# Patient Record
Sex: Female | Born: 1990 | Hispanic: Yes | Marital: Single | Smoking: Never smoker
Health system: Southern US, Community
[De-identification: ages and names within clinical notes are randomized; demographics above are authoritative.]

## PROBLEM LIST (undated history)

## (undated) DIAGNOSIS — I10 Essential (primary) hypertension: Secondary | ICD-10-CM

## (undated) DIAGNOSIS — Z789 Other specified health status: Secondary | ICD-10-CM

## (undated) HISTORY — PX: NO PAST SURGERIES: SHX2092

## (undated) HISTORY — DX: Essential (primary) hypertension: I10

---
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
## 2010-08-16 ENCOUNTER — Inpatient Hospital Stay (HOSPITAL_COMMUNITY)
Admission: AD | Admit: 2010-08-16 | Discharge: 2010-08-16 | Payer: Self-pay | Source: Home / Self Care | Admitting: Obstetrics & Gynecology

## 2010-08-27 ENCOUNTER — Ambulatory Visit (HOSPITAL_COMMUNITY)
Admission: RE | Admit: 2010-08-27 | Discharge: 2010-08-27 | Payer: Self-pay | Source: Home / Self Care | Attending: Family Medicine | Admitting: Family Medicine

## 2010-08-27 ENCOUNTER — Encounter: Payer: Self-pay | Admitting: Family Medicine

## 2010-10-17 IMAGING — US US OB TRANSVAGINAL
1 series · 14 of 28 positions shown · non-contrast
Comparison: Pelvic ultrasound 08/17/2009

CLINICAL DATA: Question miscarriage.  The patient has not had a
menstrual period since prior ultrasound performed on 08/17/2009.

TRANSVAGINAL OB ULTRASOUND
TECHNIQUE: Transabdominal ultrasound was performed for evaluation
of the gestation as well as the maternal uterus and adnexal
regions.

[Series 1: us ob comp less 14 wks · 0.12mm/px · 14 of 40 slices shown]
[im 2/40]
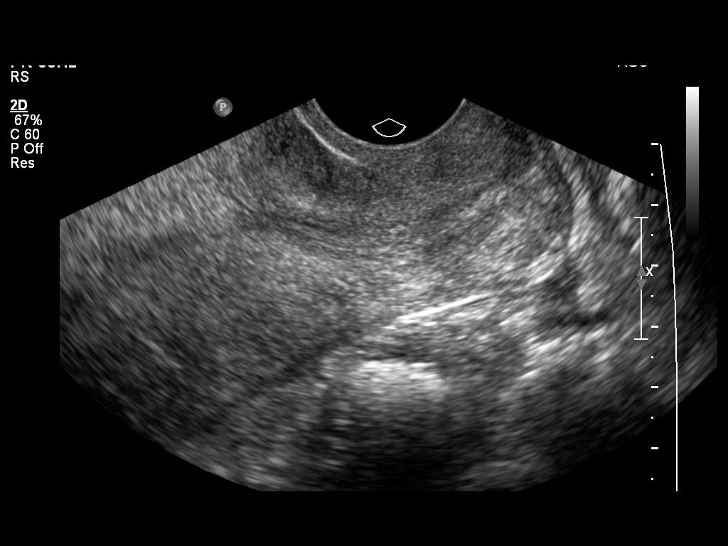
[im 5/40]
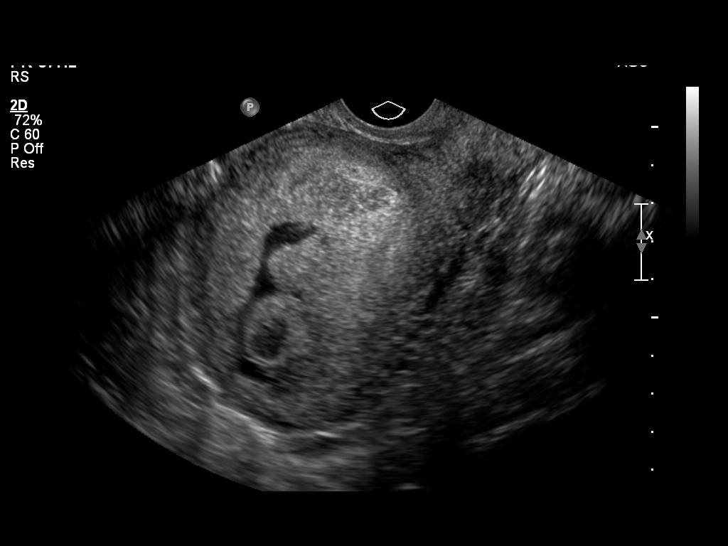
[im 8/40]
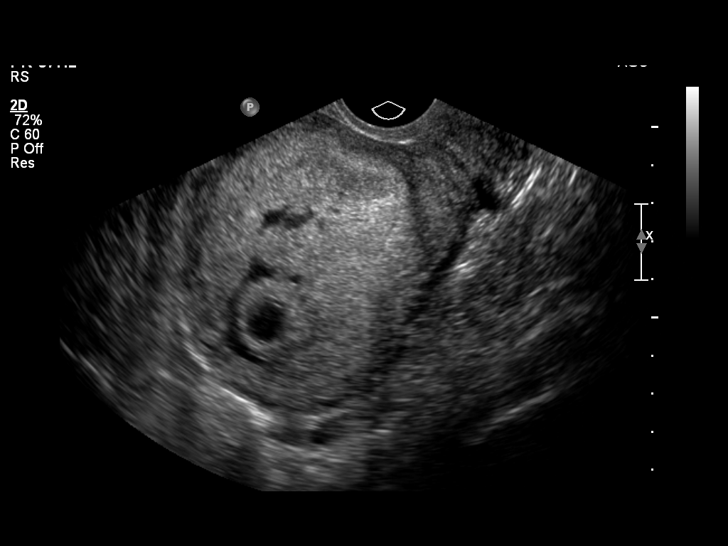
[im 11/40]
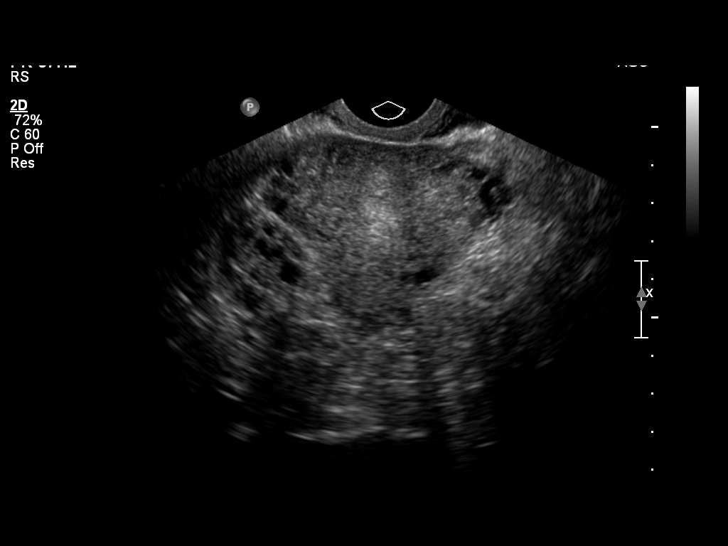
[im 14/40]
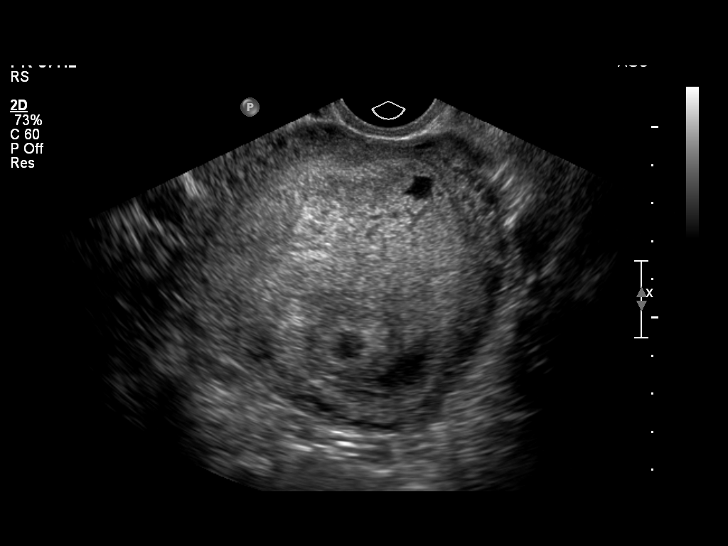
[im 16/40]
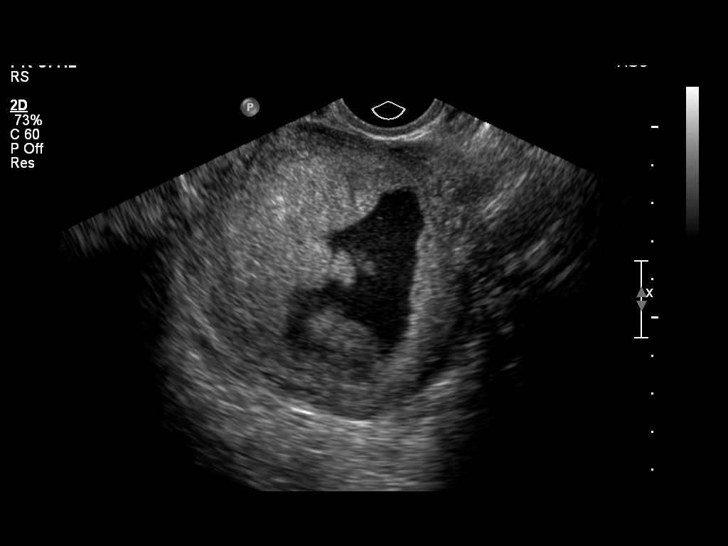
[im 19/40]
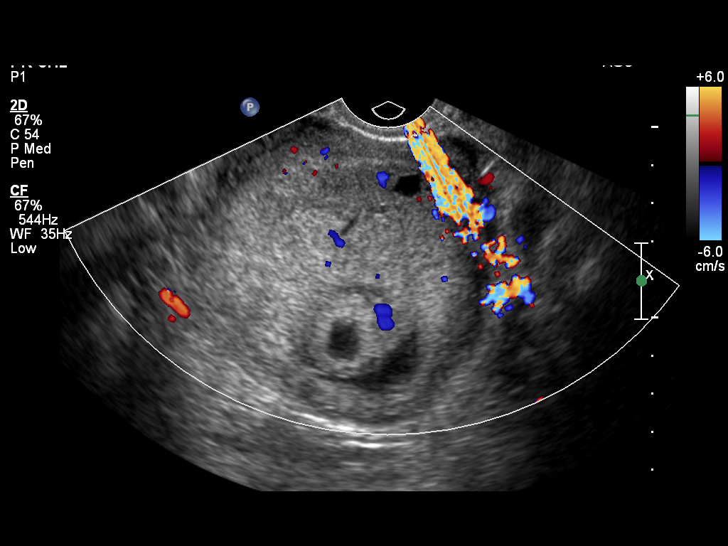
[im 22/40]
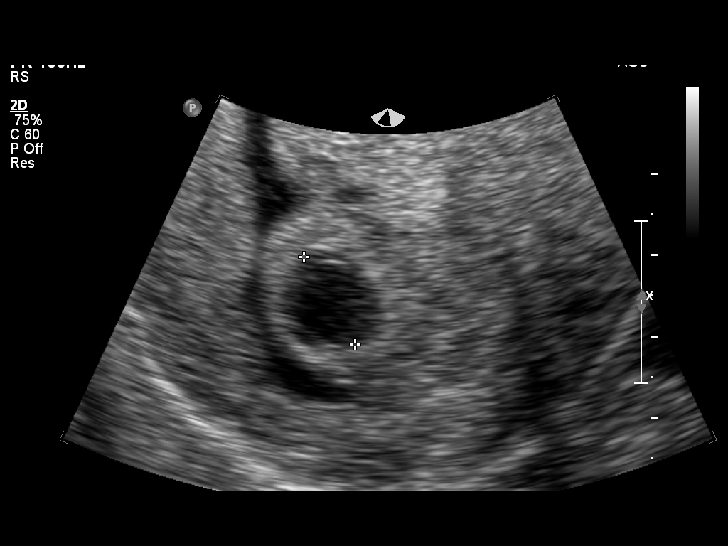
[im 25/40]
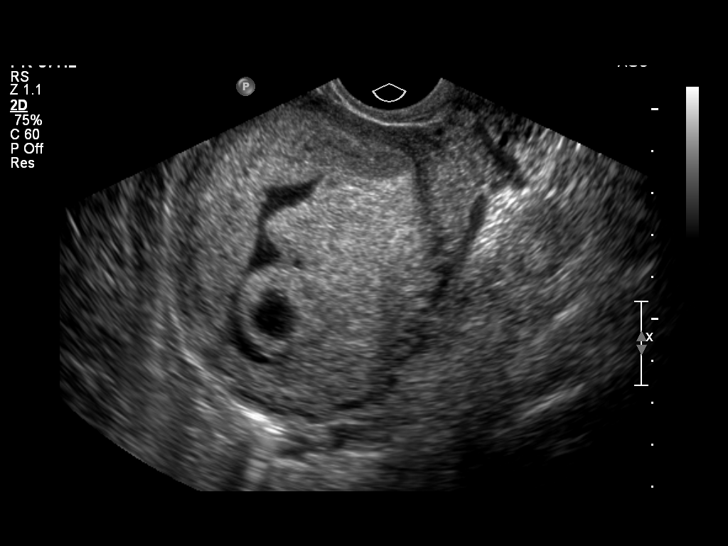
[im 28/40]
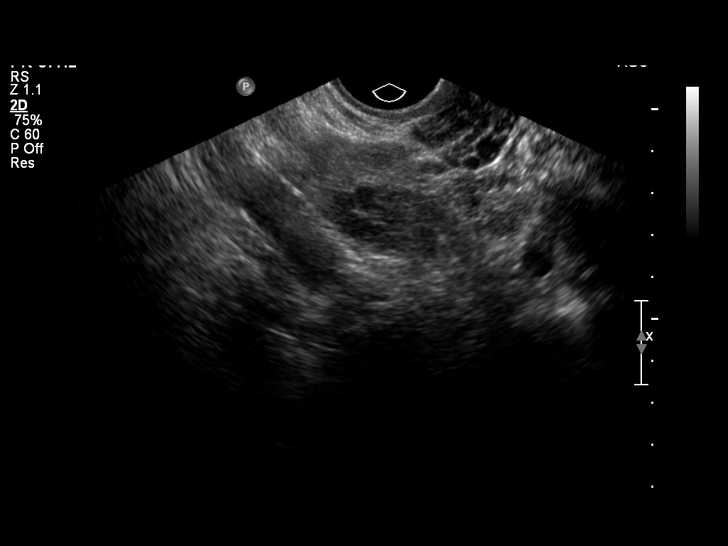
[im 31/40]
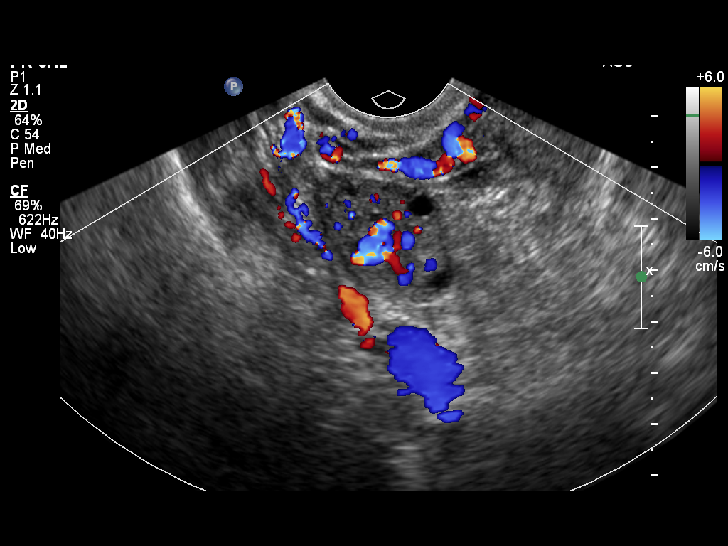
[im 34/40]
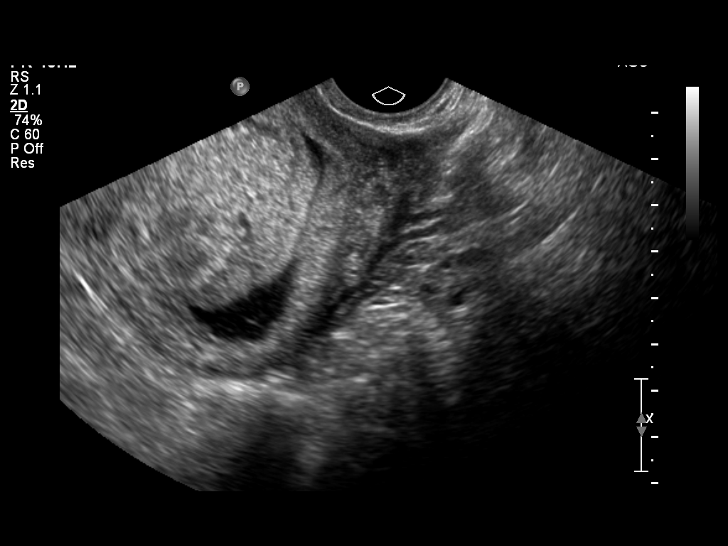
[im 37/40]
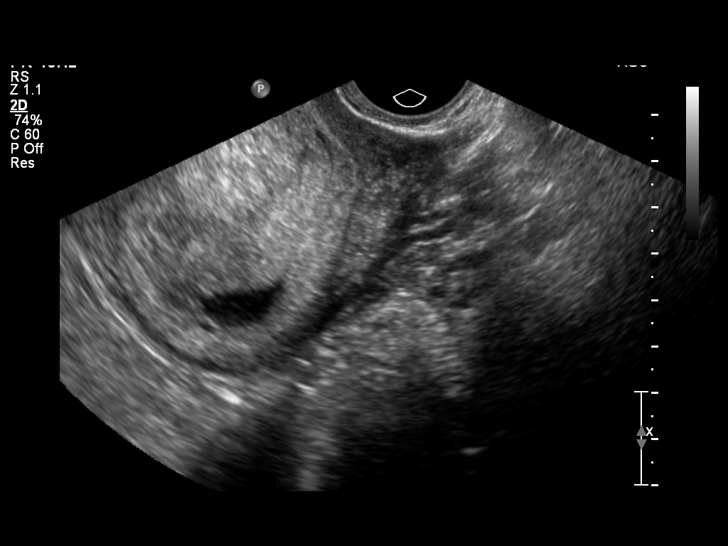
[im 40/40]
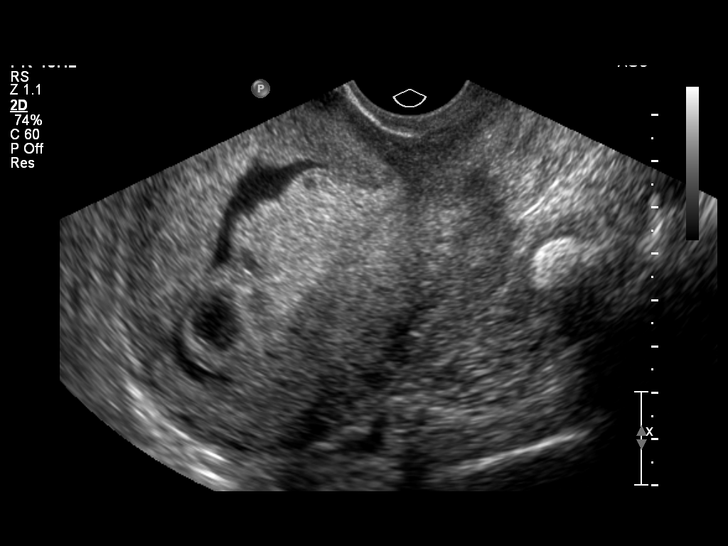

[14 of 28 positions shown; findings below may reference images not displayed]

Intrauterine gestational sac: A single intrauterine gestational sac
is identified.
Yolk sac: None visualized
Embryo: None visualized

MSD: 11 mm  6w  2d

Maternal uterus/Adnexae:
A large subchorionic hemorrhage is identified.

Both left and right maternal ovaries appear normal.  No adnexal
mass is seen.  No free pelvic fluid.
IMPRESSION: 1.  Single intrauterine gestational sac.  Given the mean sac
diameter of 11 mm, we would expect to see a yolk sac.  No yolk sac
is visible at this time.  Findings raise the question of an
abnormal IUP. Consider correlation with serial beta HCG levels, and
follow-up ultrasound as indicated.
2.  Large subchorionic hemorrhage.

## 2010-10-23 ENCOUNTER — Other Ambulatory Visit: Payer: Self-pay | Admitting: Family Medicine

## 2010-10-23 DIAGNOSIS — Z1389 Encounter for screening for other disorder: Secondary | ICD-10-CM

## 2010-10-28 ENCOUNTER — Ambulatory Visit (HOSPITAL_COMMUNITY)
Admission: RE | Admit: 2010-10-28 | Discharge: 2010-10-28 | Disposition: A | Discharge status: Home / Self Care | Payer: Self-pay | Source: Ambulatory Visit | Attending: Family Medicine | Admitting: Family Medicine

## 2010-10-28 DIAGNOSIS — Z3689 Encounter for other specified antenatal screening: Secondary | ICD-10-CM | POA: Insufficient documentation

## 2010-10-28 DIAGNOSIS — Z1389 Encounter for screening for other disorder: Secondary | ICD-10-CM

## 2010-10-28 DIAGNOSIS — E669 Obesity, unspecified: Secondary | ICD-10-CM | POA: Insufficient documentation

## 2010-11-26 LAB — COMPREHENSIVE METABOLIC PANEL
ALT: 15 U/L (ref 0–35)
AST: 18 U/L (ref 0–37)
Albumin: 3.3 g/dL — ABNORMAL LOW (ref 3.5–5.2)
CO2: 23 mEq/L (ref 19–32)
Calcium: 9.6 mg/dL (ref 8.4–10.5)
GFR calc Af Amer: >60 mL/min (ref >60)
Sodium: 136 mEq/L (ref 135–145)
Total Protein: 6.3 g/dL (ref 6.0–8.3)

## 2010-11-26 LAB — URINALYSIS, ROUTINE W REFLEX MICROSCOPIC
Bilirubin Urine: NEGATIVE
Nitrite: NEGATIVE
Specific Gravity, Urine: 1.015 (ref 1.005–1.030)
pH: 7.5 (ref 5.0–8.0)

## 2010-11-26 LAB — URINE MICROSCOPIC-ADD ON

## 2010-11-26 LAB — CBC
Hemoglobin: 12.9 g/dL (ref 12.0–15.0)
MCHC: 34.3 g/dL (ref 30.0–36.0)
Platelets: 207 10*3/uL (ref 150–400)

## 2010-12-05 LAB — CBC
HCT: 41.2 % (ref 36.0–46.0)
Hemoglobin: 13.6 g/dL (ref 12.0–15.0)
MCV: 88.6 fL (ref 78.0–100.0)
RBC: 4.65 MIL/uL (ref 3.87–5.11)
WBC: 6.8 10*3/uL (ref 4.0–10.5)

## 2010-12-05 LAB — HCG, QUANTITATIVE, PREGNANCY: hCG, Beta Chain, Quant, S: 7294 m[IU]/mL — ABNORMAL HIGH (ref <5)

## 2010-12-17 LAB — CBC
HCT: 37.1 % (ref 36.0–46.0)
Hemoglobin: 12.5 g/dL (ref 12.0–15.0)
MCV: 88.2 fL (ref 78.0–100.0)
Platelets: 262 10*3/uL (ref 150–400)
RDW: 13.9 % (ref 11.5–15.5)
WBC: 9.7 10*3/uL (ref 4.0–10.5)

## 2010-12-17 LAB — URINALYSIS, ROUTINE W REFLEX MICROSCOPIC
Bilirubin Urine: NEGATIVE
Glucose, UA: NEGATIVE mg/dL
Ketones, ur: NEGATIVE mg/dL
Specific Gravity, Urine: 1.02 (ref 1.005–1.030)
pH: 7 (ref 5.0–8.0)

## 2010-12-17 LAB — WET PREP, GENITAL: Yeast Wet Prep HPF POC: NONE SEEN

## 2010-12-17 LAB — POCT PREGNANCY, URINE: Preg Test, Ur: POSITIVE

## 2010-12-17 LAB — URINE MICROSCOPIC-ADD ON

## 2010-12-17 LAB — GC/CHLAMYDIA PROBE AMP, GENITAL: Chlamydia, DNA Probe: NEGATIVE

## 2010-12-17 LAB — HCG, QUANTITATIVE, PREGNANCY: hCG, Beta Chain, Quant, S: 40 m[IU]/mL — ABNORMAL HIGH (ref <5)

## 2011-01-24 ENCOUNTER — Other Ambulatory Visit: Payer: Self-pay

## 2011-01-24 ENCOUNTER — Inpatient Hospital Stay (HOSPITAL_COMMUNITY)
Admission: AD | Admit: 2011-01-24 | Discharge: 2011-01-27 | DRG: 775 | Disposition: A | Discharge status: Home / Self Care | Payer: Medicaid Other | Source: Ambulatory Visit | Attending: Obstetrics & Gynecology | Admitting: Obstetrics & Gynecology

## 2011-01-24 DIAGNOSIS — O480 Post-term pregnancy: Secondary | ICD-10-CM

## 2011-01-25 LAB — CBC
Hemoglobin: 14.8 g/dL (ref 12.0–15.0)
MCH: 31.1 pg (ref 26.0–34.0)
MCHC: 34 g/dL (ref 30.0–36.0)
RDW: 14.3 % (ref 11.5–15.5)

## 2011-01-25 LAB — RPR: RPR Ser Ql: NONREACTIVE

## 2011-01-25 LAB — URINALYSIS, ROUTINE W REFLEX MICROSCOPIC
Glucose, UA: NEGATIVE mg/dL
Ketones, ur: 15 mg/dL — AB
Nitrite: NEGATIVE
Specific Gravity, Urine: 1.02 (ref 1.005–1.030)
pH: 6.5 (ref 5.0–8.0)

## 2011-01-25 LAB — URINE MICROSCOPIC-ADD ON

## 2011-01-26 ENCOUNTER — Inpatient Hospital Stay (HOSPITAL_COMMUNITY)
Admission: AD | Admit: 2011-01-26 | Discharge: 2011-01-26 | Payer: Self-pay | Source: Home / Self Care | Attending: Obstetrics & Gynecology | Admitting: Obstetrics & Gynecology

## 2011-01-29 ENCOUNTER — Inpatient Hospital Stay (HOSPITAL_COMMUNITY): Admission: AD | Admit: 2011-01-29 | Payer: Self-pay | Source: Ambulatory Visit | Admitting: Obstetrics & Gynecology

## 2011-01-29 ENCOUNTER — Encounter (HOSPITAL_COMMUNITY): Payer: Self-pay | Admitting: *Deleted

## 2011-05-03 ENCOUNTER — Emergency Department (HOSPITAL_COMMUNITY)
Admission: EM | Admit: 2011-05-03 | Discharge: 2011-05-03 | Disposition: A | Discharge status: Home / Self Care | Payer: No Typology Code available for payment source | Attending: Emergency Medicine | Admitting: Emergency Medicine

## 2011-05-03 ENCOUNTER — Emergency Department (HOSPITAL_COMMUNITY): Payer: No Typology Code available for payment source

## 2011-05-03 DIAGNOSIS — M545 Low back pain, unspecified: Secondary | ICD-10-CM | POA: Insufficient documentation

## 2011-05-03 DIAGNOSIS — R0789 Other chest pain: Secondary | ICD-10-CM | POA: Insufficient documentation

## 2011-05-03 DIAGNOSIS — S335XXA Sprain of ligaments of lumbar spine, initial encounter: Secondary | ICD-10-CM | POA: Insufficient documentation

## 2011-08-17 IMAGING — US US OB DETAIL+14 WK
1 series · 12 of 28 positions shown · non-contrast
Comparison: none

[Series 1: us ob detail +14 wk · 0.22mm/px · 12 of 79 slices shown]
[im 3/79]
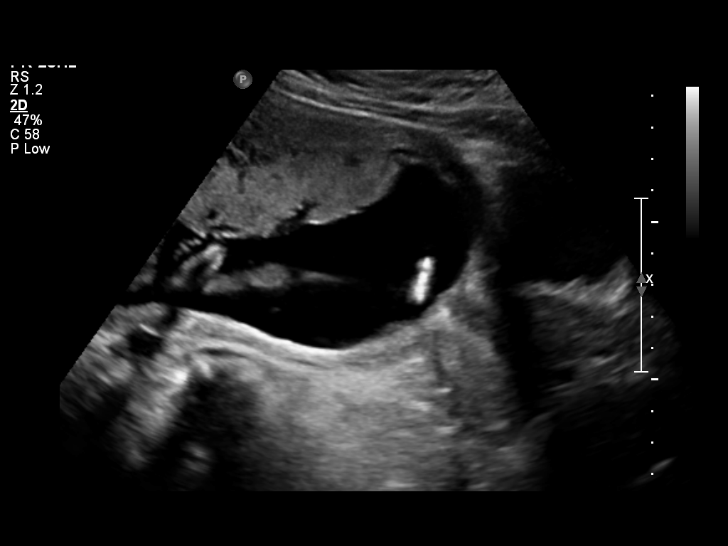
[im 9/79]
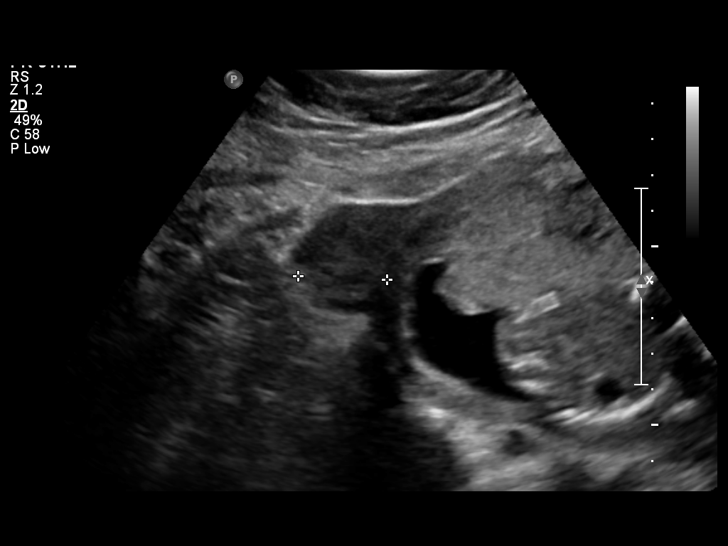
[im 15/79]
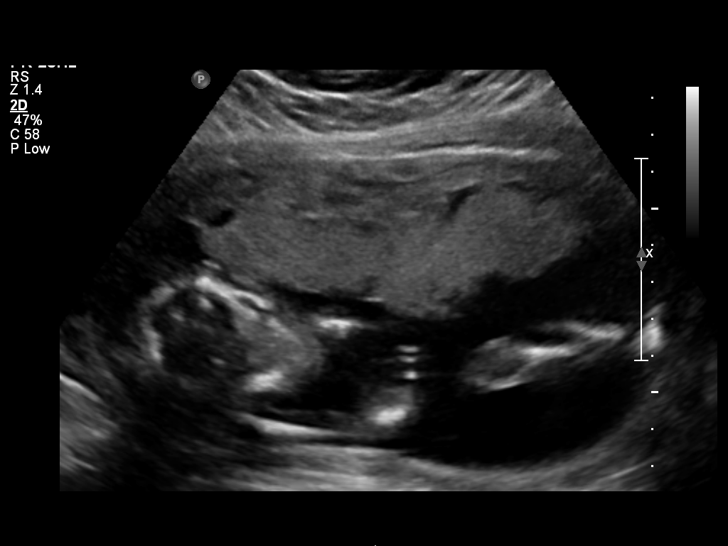
[im 24/79]
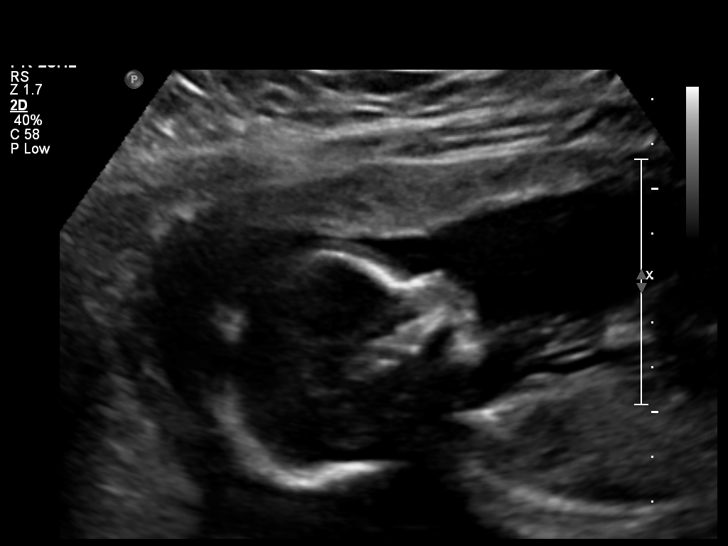
[im 29/79]
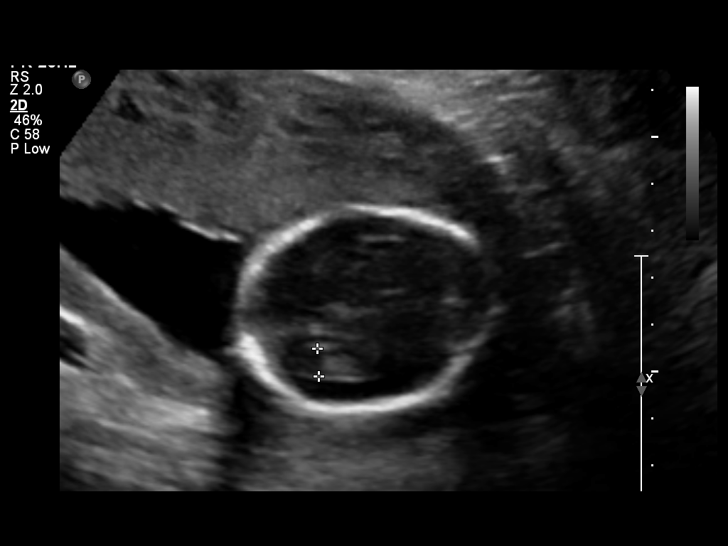
[im 35/79]
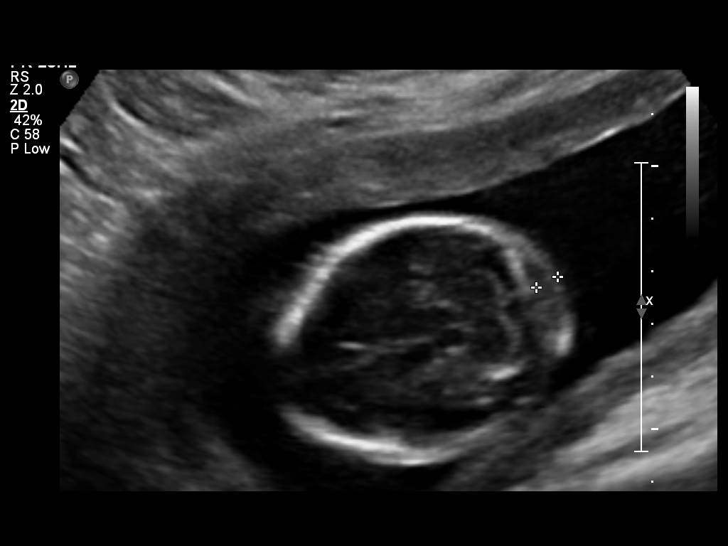
[im 44/79]
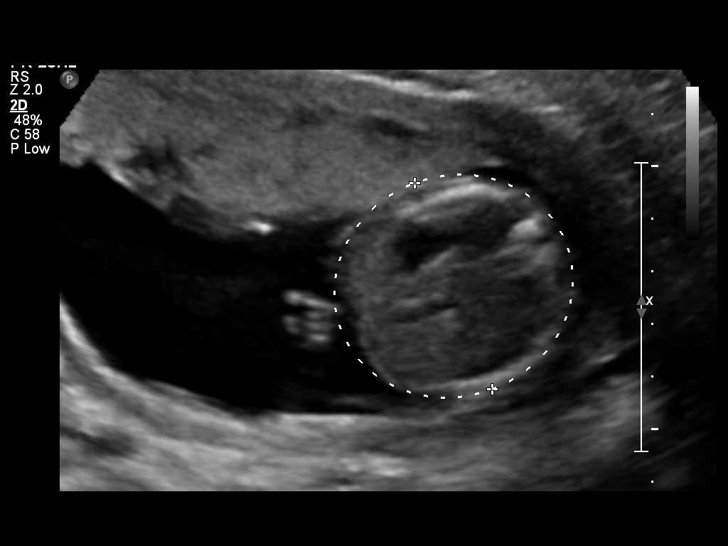
[im 50/79]
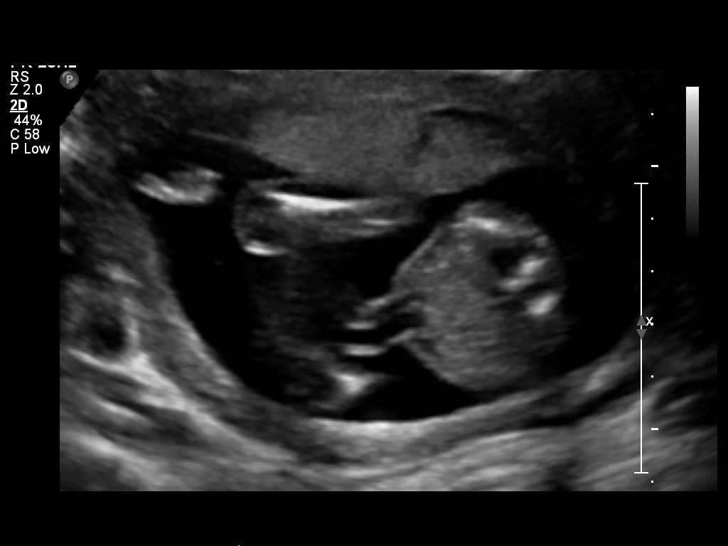
[im 55/79]
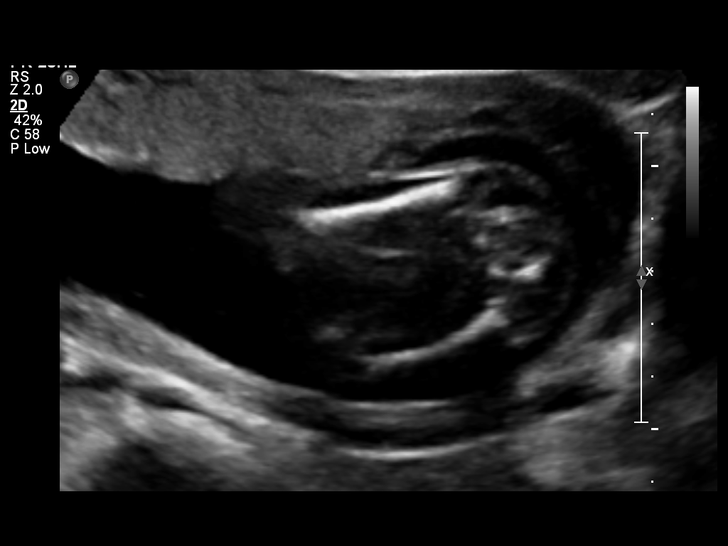
[im 64/79]
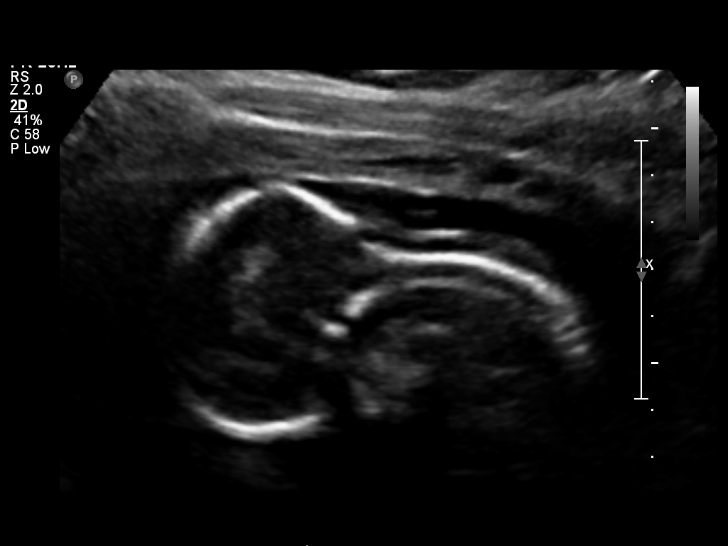
[im 70/79]
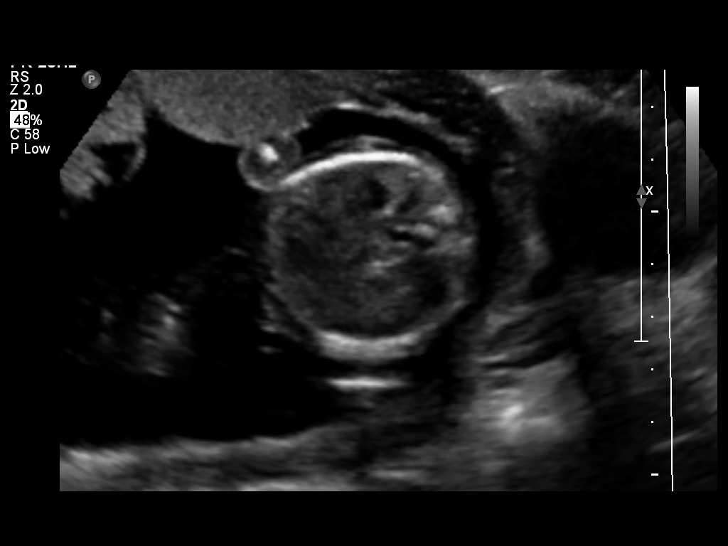
[im 76/79]
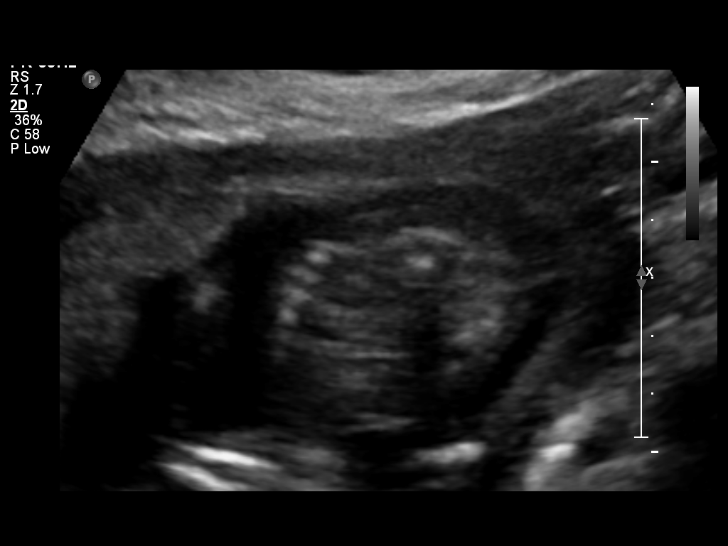

[12 of 28 positions shown; findings below may reference images not displayed]

OBSTETRICS REPORT
                      (Signed Final 08/27/2010 [DATE])

           NUTIL

 Order#:         _O
Procedures

 US OB DETAIL +14 WK                                   76811.0
Indications

 Detailed fetal anatomic survey
 Obesity
Fetal Evaluation

 Fetal Heart Rate:  150                          bpm
 Cardiac Activity:  Observed
 Presentation:      Breech
 Placenta:          Anterior, above cervical os
 P. Cord            Visualized
 Insertion:

 Amniotic Fluid
 AFI FV:      Subjectively within normal limits
                                             Larg Pckt:     5.6  cm
Biometry

 BPD:       43  mm     G. Age:  19w 0d                CI:        72.52   70 - 86
                                                      FL/HC:      18.4   16.1 -

 HC:     160.6  mm     G. Age:  18w 6d       24  %    HC/AC:      1.19   1.09 -

 AC:     135.4  mm     G. Age:  19w 0d       36  %    FL/BPD:
 FL:      29.6  mm     G. Age:  19w 1d       37  %    FL/AC:      21.9   20 - 24
 HUM:       29  mm     G. Age:  19w 3d       55  %
 CER:     19.8  mm     G. Age:  18w 6d       42  %
 NFT:     4.52  mm

 Est. FW:     271  gm    0 lb 10 oz      41  %
Gestational Age

 LMP:           19w 2d        Date:  04/14/10                 EDD:   01/19/11
 U/S Today:     19w 0d                                        EDD:   01/21/11
 Best:          19w 2d     Det. By:  LMP  (04/14/10)          EDD:   01/19/11
Anatomy

 Cranium:           Appears normal      Aortic Arch:       Not well
                                                           visualized
 Fetal Cavum:       Appears normal      Ductal Arch:       Not well
                                                           visualized
 Ventricles:        Appears normal      Diaphragm:         Appears normal
 Choroid Plexus:    Appears normal      Stomach:           Appears
                                                           normal, left
                                                           sided
 Cerebellum:        Appears normal      Abdomen:           Appears normal
 Posterior Fossa:   Appears normal      Abdominal Wall:    Appears nml
                                                           (cord insert,
                                                           abd wall)
 Nuchal Fold:       Appears normal      Cord Vessels:      Appears normal
                    (neck, nuchal                          (3 vessel cord)
                    fold)
 Face:              Lips and orbits     Kidneys:           Appear normal
                    appear normal
 Heart:             Not well            Bladder:           Appears normal
                    visualized
 RVOT:              Not well            Spine:             Not well
                    visualized                             visualized
 LVOT:              Not well            Limbs:             Appears normal
                    visualized                             (hands, ankles,
                                                           feet)

 Other:     Fetus appears to be a female. Heels visualized. Open
            hands visualized bilaterally. Technically difficult due to
            maternal habitus.
Cervix Uterus Adnexa

 Cervical Length:    3.8      cm

 Cervix:       Normal appearance by transabdominal scan.
 Left Ovary:    Within normal limits.
 Right Ovary:   Within normal limits.

 Adnexa:     No abnormality visualized.
Impression

 Single living intrauterine gestation with concordant
 gestational age and normal visualized anatomy.   The fetal
 heart is incompletely seen today, so follow up ultrasound in 2
 weeks is recommended.

## 2014-07-17 ENCOUNTER — Encounter (HOSPITAL_COMMUNITY): Payer: Self-pay | Admitting: *Deleted

## 2016-09-04 ENCOUNTER — Emergency Department (HOSPITAL_COMMUNITY)
Admission: EM | Admit: 2016-09-04 | Discharge: 2016-09-04 | Disposition: A | Discharge status: Home / Self Care | Payer: Self-pay | Attending: Emergency Medicine | Admitting: Emergency Medicine

## 2016-09-04 ENCOUNTER — Emergency Department (HOSPITAL_COMMUNITY): Payer: Self-pay

## 2016-09-04 ENCOUNTER — Encounter (HOSPITAL_COMMUNITY): Payer: Self-pay

## 2016-09-04 DIAGNOSIS — O034 Incomplete spontaneous abortion without complication: Secondary | ICD-10-CM

## 2016-09-04 DIAGNOSIS — O0281 Inappropriate change in quantitative human chorionic gonadotropin (hCG) in early pregnancy: Secondary | ICD-10-CM | POA: Insufficient documentation

## 2016-09-04 DIAGNOSIS — Z3490 Encounter for supervision of normal pregnancy, unspecified, unspecified trimester: Secondary | ICD-10-CM

## 2016-09-04 DIAGNOSIS — O039 Complete or unspecified spontaneous abortion without complication: Secondary | ICD-10-CM | POA: Insufficient documentation

## 2016-09-04 DIAGNOSIS — O4690 Antepartum hemorrhage, unspecified, unspecified trimester: Secondary | ICD-10-CM

## 2016-09-04 DIAGNOSIS — Z3A01 Less than 8 weeks gestation of pregnancy: Secondary | ICD-10-CM | POA: Insufficient documentation

## 2016-09-04 LAB — I-STAT BETA HCG BLOOD, ED (MC, WL, AP ONLY): I-stat hCG, quantitative: >2000 m[IU]/mL — ABNORMAL HIGH (ref <5)

## 2016-09-04 LAB — HCG, QUANTITATIVE, PREGNANCY: HCG, BETA CHAIN, QUANT, S: 40446 m[IU]/mL — AB (ref <5)

## 2016-09-04 LAB — ABO/RH: ABO/RH(D): O POS

## 2016-09-04 NOTE — ED Notes (Signed)
Pt is in stable condition upon d/c and ambulates from ED. 

## 2016-09-04 NOTE — ED Triage Notes (Signed)
Pt seen at outpatient clinic and told her US was abnormal. Sent here for follow up. Pt reports she has had a positive pregnancy test.

## 2016-09-04 NOTE — ED Provider Notes (Signed)
[facility]-EMERGENCY DEPT Provider Note   CSN: [id number] Arrival date & time: 09/04/16  1455   By signing my name below, I, Teofilo Pod, attest that this documentation has been prepared under the direction and in the presence of Rolland Porter, MD . Electronically Signed: Teofilo Pod, ED Scribe. 09/04/2016. 4:10 PM.   History   Chief Complaint Chief Complaint  Patient presents with  . Pregnancy Ultrasound    The history is provided by the patient. No language interpreter was used.   HPI Comments:  Sheryl Carroll is a 25 y.o. female who presents to the Emergency Department, here to follow up for an ultrasound. Pt is G3P2A0, LNMP was 06/30/16. Pt reports that she recently had an ultrasound and was told that there was an abnormality. Pt reports associated nausea and mild vaginal bleeding today. Pt has eaten and has had fluids today. No alleviating factors noted. Pt denies vomiting.    History reviewed. No pertinent past medical history.  There are no active problems to display for this patient.   History reviewed. No pertinent surgical history.  OB History    Gravida Para Term Preterm AB Living   1             SAB TAB Ectopic Multiple Live Births                   Home Medications    Prior to Admission medications   Not on File    Family History History reviewed. No pertinent family history.  Social History Social History  Substance Use Topics  . Smoking status: Never Smoker  . Smokeless tobacco: Never Used  . Alcohol use No     Allergies   Patient has no known allergies.   Review of Systems Review of Systems  Constitutional: Negative for appetite change, chills, diaphoresis, fatigue and fever.  HENT: Negative for mouth sores, sore throat and trouble swallowing.   Eyes: Negative for visual disturbance.  Respiratory: Negative for cough, chest tightness, shortness of breath and wheezing.   Cardiovascular: Negative for chest pain.  Gastrointestinal:  Positive for nausea. Negative for abdominal distention, abdominal pain, diarrhea and vomiting.  Endocrine: Negative for polydipsia, polyphagia and polyuria.  Genitourinary: Positive for vaginal bleeding. Negative for dysuria, frequency and hematuria.  Musculoskeletal: Negative for gait problem.  Skin: Negative for color change, pallor and rash.  Neurological: Negative for dizziness, syncope, light-headedness and headaches.  Hematological: Does not bruise/bleed easily.  Psychiatric/Behavioral: Negative for behavioral problems and confusion.  All other systems reviewed and are negative.    Physical Exam Updated Vital Signs BP 143/85   Pulse 82   Temp 98.3 F (36.8 C) (Oral)   Resp 16   LMP 06/30/2016 (Exact Date)   SpO2 100%   Physical Exam  Constitutional: She is oriented to person, place, and time. She appears well-developed and well-nourished. No distress.  HENT:  Head: Normocephalic.  Eyes: Conjunctivae are normal. Pupils are equal, round, and reactive to light. No scleral icterus.  Neck: Normal range of motion. Neck supple. No thyromegaly present.  Cardiovascular: Normal rate and regular rhythm.  Exam reveals no gallop and no friction rub.   No murmur heard. Pulmonary/Chest: Effort normal and breath sounds normal. No respiratory distress. She has no wheezes. She has no rales.  Abdominal: Soft. Bowel sounds are normal. She exhibits no distension. There is no tenderness. There is no rebound.  Musculoskeletal: Normal range of motion.  Neurological: She is alert and oriented to person,  place, and time.  Skin: Skin is warm and dry. No rash noted.  Psychiatric: She has a normal mood and affect. Her behavior is normal.     ED Treatments / Results  DIAGNOSTIC STUDIES:  Oxygen Saturation is 100% on RA, normal by my interpretation.    COORDINATION OF CARE:  4:11 PM Discussed treatment plan with pt at bedside and pt agreed to plan.   Labs (all labs ordered are listed, but  only abnormal results are displayed) Labs Reviewed  HCG, QUANTITATIVE, PREGNANCY - Abnormal; Notable for the following:       Result Value   hCG, Beta Chain, Quant, S 40,446 (*)    All other components within normal limits  I-STAT BETA HCG BLOOD, ED ([facility], [facility], [facility] ONLY) - Abnormal; Notable for the following:    I-stat hCG, quantitative >2,000.0 (*)    All other components within normal limits  ABO/RH    EKG  EKG Interpretation None       Radiology [place] Ob Comp Less 14 Wks  Result Date: 09/04/2016 CLINICAL DATA:  Pregnant patient in first-trimester pregnancy with vaginal bleeding. EXAM: OBSTETRIC <14 WK US AND TRANSVAGINAL OB US TECHNIQUE: Both transabdominal and transvaginal ultrasound examinations were performed for complete evaluation of the gestation as well as the maternal uterus, adnexal regions, and pelvic cul-de-sac. Transvaginal technique was performed to assess early pregnancy. COMPARISON:  None. FINDINGS: Intrauterine gestational sac: Single Yolk sac:  Tentatively visualized. Embryo:  Visualized. Cardiac Activity: Not present. CRL:  4.6  mm   6 w   1 d                  US EDC: 04/29/2017 Subchorionic hemorrhage: Moderate to large measuring up to 2.2 x 2 cm. Maternal uterus/adnexae: The right ovary is normal. Left ovary is normal in size and contains a 1.8 cm cyst. There is no pelvic free fluid. IMPRESSION: Intrauterine gestation with fetal crown-rump length of 4.6 mm, and no cardiac activity. Moderate to large subchorionic hemorrhage. Findings are suspicious but not yet definitive for failed pregnancy (unless patient has had previous ultrasound confirming fetal cardiac activity, IF there has been prior confirmation of fetal cardiac activity, findings meet definitive criteria for failed pregnancy). Recommend follow-up US in 10-14 days for definitive diagnosis. This recommendation follows SRU consensus guidelines: Diagnostic Criteria for Nonviable Pregnancy Early in the First Trimester. Malva Limes  Engl J Med 2013; [id number]; [id number]. Electronically Signed   By: Rubye OaksMelanie  Ehinger M.D.   On: 09/04/2016 18:25   [place] Ob Transvaginal  Result Date: 09/04/2016 CLINICAL DATA:  Pregnant patient in first-trimester pregnancy with vaginal bleeding. EXAM: OBSTETRIC <14 WK US AND TRANSVAGINAL OB US TECHNIQUE: Both transabdominal and transvaginal ultrasound examinations were performed for complete evaluation of the gestation as well as the maternal uterus, adnexal regions, and pelvic cul-de-sac. Transvaginal technique was performed to assess early pregnancy. COMPARISON:  None. FINDINGS: Intrauterine gestational sac: Single Yolk sac:  Tentatively visualized. Embryo:  Visualized. Cardiac Activity: Not present. CRL:  4.6  mm   6 w   1 d                  US EDC: 04/29/2017 Subchorionic hemorrhage: Moderate to large measuring up to 2.2 x 2 cm. Maternal uterus/adnexae: The right ovary is normal. Left ovary is normal in size and contains a 1.8 cm cyst. There is no pelvic free fluid. IMPRESSION: Intrauterine gestation with fetal crown-rump length of 4.6 mm, and no cardiac activity. Moderate to large subchorionic hemorrhage.  Findings are suspicious but not yet definitive for failed pregnancy (unless patient has had previous ultrasound confirming fetal cardiac activity, IF there has been prior confirmation of fetal cardiac activity, findings meet definitive criteria for failed pregnancy). Recommend follow-up US in 10-14 days for definitive diagnosis. This recommendation follows SRU consensus guidelines: Diagnostic Criteria for Nonviable Pregnancy Early in the First Trimester. Malva Limes Engl J Med 2013; [id number]; [id number]. Electronically Signed   By: Rubye OaksMelanie  Ehinger M.D.   On: 09/04/2016 18:25    Procedures Procedures (including critical care time)  Medications Ordered in ED Medications - No data to display   Initial Impression / Assessment and Plan / ED Course  I have reviewed the triage vital signs and the nursing notes.  Pertinent labs &  imaging results that were available during my care of the patient were reviewed by me and considered in my medical decision making (see chart for details).  Clinical Course     Quantitative hCG is greater than 40,000. No heartbeat seen on ultrasound with intrauterine sac. Inevitable miscarriage and failed pregnancy discussed with patient via interpreter. She expresses understanding. Referred to women's hospital clinic for appointment. Instructed that if symptoms are mild including mild pain, slight bleeding, and passage of tissue at home, that she could stay home. [facility] with sudden severe bleeding, or severe pain. Again she expresses understanding.  Final Clinical Impressions(s) / ED Diagnoses   Final diagnoses:  Pregnancy, unspecified gestational age  Inevitable abortion    New Prescriptions New Prescriptions   No medications on file     Rolland PorterMark Abbott Jasinski, MD 09/04/16 1859

## 2016-09-04 NOTE — Discharge Instructions (Signed)
The pregnancy has stopped progressing and you will eventually have a miscarriage. Call the Women's clinic to make an appointment to be rechecked. If you have mild bleeding and cramping and pass tissue at home, you do not have to go to hospital. If you have bleeding more than 4 pads in 2 hours, sudden severe bleeding, or severe pain, go to women's hospital immediately.

## 2016-09-05 ENCOUNTER — Encounter (HOSPITAL_COMMUNITY): Payer: Self-pay | Admitting: *Deleted

## 2016-10-08 ENCOUNTER — Ambulatory Visit: Payer: Self-pay | Admitting: *Deleted

## 2016-10-08 ENCOUNTER — Encounter: Payer: Self-pay | Admitting: Obstetrics & Gynecology

## 2016-10-08 ENCOUNTER — Ambulatory Visit (HOSPITAL_COMMUNITY)
Admission: RE | Admit: 2016-10-08 | Discharge: 2016-10-08 | Disposition: A | Discharge status: Home / Self Care | Payer: Self-pay | Source: Ambulatory Visit | Attending: Obstetrics & Gynecology | Admitting: Obstetrics & Gynecology

## 2016-10-08 ENCOUNTER — Ambulatory Visit (INDEPENDENT_AMBULATORY_CARE_PROVIDER_SITE_OTHER): Payer: Medicaid Other | Admitting: Obstetrics & Gynecology

## 2016-10-08 VITALS — BP 137/72 | HR 77 | Wt 215.2 lb

## 2016-10-08 DIAGNOSIS — B9689 Other specified bacterial agents as the cause of diseases classified elsewhere: Secondary | ICD-10-CM

## 2016-10-08 DIAGNOSIS — Z113 Encounter for screening for infections with a predominantly sexual mode of transmission: Secondary | ICD-10-CM

## 2016-10-08 DIAGNOSIS — Z712 Person consulting for explanation of examination or test findings: Secondary | ICD-10-CM

## 2016-10-08 DIAGNOSIS — O209 Hemorrhage in early pregnancy, unspecified: Secondary | ICD-10-CM

## 2016-10-08 DIAGNOSIS — N760 Acute vaginitis: Secondary | ICD-10-CM

## 2016-10-08 DIAGNOSIS — Z3A00 Weeks of gestation of pregnancy not specified: Secondary | ICD-10-CM | POA: Insufficient documentation

## 2016-10-08 DIAGNOSIS — O039 Complete or unspecified spontaneous abortion without complication: Secondary | ICD-10-CM

## 2016-10-08 LAB — POCT PREGNANCY, URINE: Preg Test, Ur: POSITIVE — AB

## 2016-10-08 NOTE — Progress Notes (Signed)
Pt rtc for u/s results. Reviewed results with Dr Macon LargeAnyanwu, she spoke with patient.

## 2016-10-08 NOTE — Progress Notes (Signed)
GYNECOLOGY OFFICE VISIT NOTE  History:  26 y.o. G1P0 here today for evaluation in early pregnancy. She was seen in the ED on 09/04/16 for bleeding too, ultrasound showed 6 week IUP with no cardiac activity, and moderate to large subchorionic hemorrhage. This was suspicious but not yet definitive for failed pregnancy. She was told to follow up in 10-14 days but did not do so. In the interim, she went to [facility] to sign up for prenatal care but was told to come here given the aforementioned findings. She reports no bleeding or pain since 09/04/16 until yesterday when she noticed small amount of pink discharge when wiping. She denies any abnormal vaginal discharge, pelvic pain or other concerns.   History reviewed. No pertinent past medical history.  History reviewed. No pertinent surgical history.  The following portions of the patient's history were reviewed and updated as appropriate: allergies, current medications, past family history, past medical history, past social history, past surgical history and problem list.   Health Maintenance:  Normal pap at [facility] last year.  Review of Systems:  Pertinent items noted in HPI and remainder of comprehensive ROS otherwise negative.   Objective:  Physical Exam BP 137/72   Pulse 77   Wt 215 lb 3.2 oz (97.6 kg)   LMP 06/30/2016 (Exact Date)  CONSTITUTIONAL: Well-developed, well-nourished female in no acute distress.  HENT:  Normocephalic, atraumatic. External right and left ear normal. Oropharynx is clear and moist EYES: Conjunctivae and EOM are normal. Pupils are equal, round, and reactive to light. No scleral icterus.  NECK: Normal range of motion, supple, no masses SKIN: Skin is warm and dry. No rash noted. Not diaphoretic. No erythema. No pallor. NEUROLOGIC: Alert and oriented to person, place, and time. Normal reflexes, muscle tone coordination. No cranial nerve deficit noted. PSYCHIATRIC: Normal mood and affect. Normal behavior. Normal  judgment and thought content. CARDIOVASCULAR: Normal heart rate noted RESPIRATORY: Effort and breath sounds normal, no problems with respiration noted ABDOMEN: Soft, no distention noted.   PELVIC: Normal appearing external genitalia; normal appearing vaginal mucosa and cervix.  Scant brown discharge noted, no active bleeding.  Sample taken for testing.  Normal uterine size, no other palpable masses, no uterine or adnexal tenderness. MUSCULOSKELETAL: Normal range of motion. No edema noted.  Labs and Imaging 10/08/2016  Clinic UPT  Faintly positive  09/04/2016  OBSTETRIC <14 WK [place] AND TRANSVAGINAL OB US  CLINICAL DATA:  Pregnant patient in first-trimester pregnancy with vaginal bleeding.  COMPARISON:  None.  FINDINGS: Intrauterine gestational sac: Single  Yolk sac:  Tentatively visualized.  Embryo:  Visualized.  Cardiac Activity: Not present.  CRL:  4.6  mm   6 w   1 d                  [place] EDC: 04/29/2017  Subchorionic hemorrhage: Moderate to large measuring up to 2.2 x 2cm.  Maternal uterus/adnexae: The right ovary is normal. Left ovary is normal in size and contains a 1.8 cm cyst. There is no pelvic free fluid.  IMPRESSION: Intrauterine gestation with fetal crown-rump length of 4.6 mm, and no cardiac activity. Moderate to large subchorionic hemorrhage. Findings are suspicious but not yet definitive for failed pregnancy (unless patient has had previous ultrasound confirming fetal cardiac activity, IF there has been prior confirmation of fetal cardiac activity, findings meet definitive criteria for failed pregnancy).  Recommend follow-up US in 10-14 days for definitive diagnosis. This recommendation follows SRU consensus guidelines: Diagnostic Criteria for Nonviable Pregnancy  Early in the First Trimester. Malva Limes Engl J Med 2013; [id number]; [id number].   Electronically Signed   By: Rubye OaksMelanie  Ehinger M.D.   On: 09/04/2016 18:25  Assessment & Plan:  Bleeding in early  pregnancy Will get ultrasound for definitive diagnosis and discuss with patient afterwards. Sent to Ultrasound Department  - US OB Transvaginal; Future  Addendum: Completed SAB (spontaneous abortion) as per scan below:  [place] Ob Transvaginal  Result Date: 10/08/2016 CLINICAL DATA:  Bleeding EXAM: TRANSVAGINAL OB ULTRASOUND TECHNIQUE: Transvaginal ultrasound was performed for complete evaluation of the gestation as well as the maternal uterus, adnexal regions, and pelvic cul-de-sac. COMPARISON:  None. FINDINGS: Intrauterine gestational sac: None visualized Yolk sac:  None visualized Embryo:  None visualized Cardiac Activity: Heart Rate:  bpm MSD:   mm    w     d CRL:     mm    w  d                  US EDC: Subchorionic hemorrhage:  None visualized. Maternal uterus/adnexae: No adnexal masses or free fluid. Thickened, heterogeneous endometrium IMPRESSION: No intrauterine pregnancy visualized. Differential considerations would include early intrauterine pregnancy too early to visualize, spontaneous abortion, or occult ectopic pregnancy. Recommend close clinical followup and serial quantitative beta HCGs and ultrasounds. Electronically Signed   By: Charlett NoseKevin  Dover M.D.   On: 10/08/2016 14:52   Results discussed with patient, appropriate support given. She wants to try again to conceive, encouraged to take prenatal vitamins daily for now and avoid teratogens.  Routine preventative health maintenance measures emphasized. Please refer to After Visit Summary for other counseling recommendations.    Total face-to-face time with patient: 30 minutes. Over 50% of encounter was spent on counseling and coordination of care.   Jaynie CollinsUGONNA  Machai Desmith, MD, FACOG Attending Obstetrician & Gynecologist, [facility], [facility]

## 2016-10-08 NOTE — Patient Instructions (Addendum)
Gracias por inscribirse en MyChart. Siga las instrucciones a continuacin para acceder de Wellsite geologistmanera segura a su registro medico en lnea. MyChart le permite enviarle mensajes a su mdico, consultar los resultados de sus exmenes, Company secretarymanejar sus citas y mucho ms.  Cmo me inscribo? 1. En su navegador de Internet, vaya a Nurse, learning disabilityAddress Bar (barra de direcciones) e ingrese [email or web address]. 2. Haga clic en el enlace de Sign Up Now (Registrarse ahora) en la casilla de Sign In (Inicio de sesin). Ver la pgina de New Member Sign Up (Registro de Lavinianuevo miembro). 3. Ingrese su Access Code (cdigo de Teacher, adult educationacceso) de MyChart exactamente como aparece a continuacin. No tendr CIT Groupque usar este cdigo despus de completar el proceso de Engineer, maintenance (IT)registro. Si no se registra antes de la fecha de vencimiento, debe solicitar un nuevo cdigo.  Cdigo de acceso de MyChart: [id number] Expires: 11/03/2016  6:58 PM  4. Ingrese su nmero de Seguro Social ([id number]) y fecha de nacimiento (mm/dd/aaa) como se indica y haga clic en  Submit Art gallery manager(Enviar). Se le llevar a la siguiente pgina de registro. 5. Create a MyChart ID (Crear una identificacin de MyChart). Esta ser su identificacin de inicio de sesin de MyChart y no se puede cambiar, as que Reliant Energypiense en una que sea segura y fcil de Clinical research associaterecordar. 6. Crear una contrasea de MyChart. Puede modificar su contrasea en cualquier momento. 7. Ingrese su pregunta y respuesta para restablecer su contrasea. Esto se puede usar ms adelante si olvida su contrasea.  8. Sheryl FreestoneEscriba su direccin de correo electrnico. Recibir un correo electrnico cuando haya nueva informacin disponible en MyChart. 9. Haga clic en Sign Up (Registrarse). Ahora puede ver su registro mdico.  Informacin adicional Recuerde, MyChart NO se puede usar para necesidades urgentes. Para emergencias mdicas, marque el 911.        Aborto espontneo (Miscarriage) El aborto espontneo es la prdida de un beb que no  ha nacido (feto) antes de la semana 20 del Psychiatristembarazo. La mayor parte de estos abortos ocurre en los primeros 3 meses. En algunos casos ocurre antes de que la mujer sepa que est Hintonembarazada. Tambin se denomina "aborto espontneo" o "prdida prematura del embarazo". El aborto espontneo puede ser Neomia Dearuna experiencia que afecte emocionalmente a Dealerla persona. Converse con su mdico si tiene dudas, cmo es el proceso de Masontownduelo, y sobre planes futuros de Psychiatristembarazo. CAUSAS  Algunos problemas cromosmicos pueden hacer imposible que el beb se desarrolle normalmente. Los problemas con los genes o cromosomas del beb son generalmente el resultado de errores que se producen, por casualidad, cuando el embrin se divide y crece. Estos problemas no se heredan de los Waverlypadres.  Infeccin en el cuello del tero.  Problemas hormonales.  Problemas en el cuello del tero, como tener un tero incompetente. Esto ocurre cuando los tejidos no son lo suficientemente fuertes como para Arts administratorcontener el embarazo.  Problemas del tero, como un tero con forma anormal, los fibromas o anormalidades congnitas.  Ciertas enfermedades crnicas.  No fume, no beba alcohol, ni consuma drogas.  Traumatismos A veces, la causa es desconocida. SNTOMAS  Sangrado o manchado vaginal, con o sin clicos o dolor.  Dolor o clicos en el abdomen o en la cintura.  Eliminacin de lquido, tejidos o cogulos grandes por la vagina. DIAGNSTICO El Office Depotmdico le har un examen fsico. Tambin le indicar una ecografa para confirmar el aborto. Es posible que se realicen anlisis de [place] Estatessangre. TRATAMIENTO  En algunos casos el tratamiento no es necesario, si se eliminan naturalmente todos los tejidos embrionarios  que se encontraban en el tero. Si el feto o la placenta quedan dentro del tero (aborto incompleto), pueden infectarse, los tejidos que quedan pueden infectarse y deben retirarse. Generalmente se realiza un procedimiento de dilatacin y curetaje (D y  C). Durante el procedimiento de dilatacin y curetaje, el cuello del tero se abre (dilata) y se retira cualquier resto de tejido fetal o placentario del tero.  Si hay una infeccin, le recetarn antibiticos. Podrn recetarle otros medicamentos para reducir el tamao del tero (contraerlo) si hay una mucho sangrado.  Si su sangre es Rh negativa y su beb es Rh positivo, usted necesitar la inyeccin de inmunoglobulina Rh. Esta inyeccin proteger a los futuros bebs de tener problemas de compatibilidad Rh en futuros embarazos. INSTRUCCIONES PARA EL CUIDADO EN EL HOGAR  El mdico le indicar reposo en cama o le permitir Dance movement psychotherapist. Vuelva a la actividad lentamente o segn las indicaciones de su mdico.  Pdale a alguien que la ayude con las responsabilidades familiares y del hogar durante este tiempo.  Lleve un registro de la cantidad y la saturacin de las toallas higinicas que Landscape architect. Anote esta informacin  No use tampones. No No se haga duchas vaginales ni tenga relaciones sexuales hasta que el mdico la autorice.  Slo tome medicamentos de venta libre o recetados para Primary school teacher o Environmental health practitioner, segn las indicaciones de su mdico.  No tome aspirina. La aspirina puede ocasionar hemorragias.  Concurra puntualmente a las citas de control con el mdico.  Si usted o su pareja tienen dificultades con el duelo, hable con su mdico para buscar la [employer] ayude a Runner, broadcasting/film/video la prdida del Psychiatrist. Permtase el tiempo suficiente de duelo antes de quedar embarazada nuevamente. SOLICITE ATENCIN MDICA DE INMEDIATO SI:  Siente calambres intensos o dolor en la espalda o en el abdomen.  Tiene fiebre.  Elimina grandes cogulos de [place] (del tamao de una nuez o ms) o tejidos por la vagina. Guarde lo que ha eliminado para que su mdico lo examine.  La hemorragia aumenta.  Brett Fairy secrecin vaginal espesa y con mal olor.  Se siente  mareada, dbil, o se desmaya.  Siente escalofros. ASEGRESE DE QUE:  Comprende estas instrucciones.  Controlar su enfermedad.  Solicitar ayuda de inmediato si no mejora o si empeora. Esta informacin no tiene Theme park manager el consejo del mdico. Asegrese de hacerle al mdico cualquier pregunta que tenga. Document Released: 06/11/2005 Document Revised: 12/27/2012 Document Reviewed: 10/21/2011 Elsevier Interactive Patient Education  2017 [employer].

## 2016-10-09 LAB — CERVICOVAGINAL ANCILLARY ONLY
Bacterial vaginitis: POSITIVE — AB
CANDIDA VAGINITIS: NEGATIVE
CHLAMYDIA, DNA PROBE: NEGATIVE
Neisseria Gonorrhea: NEGATIVE
Trichomonas: NEGATIVE

## 2016-10-10 ENCOUNTER — Telehealth: Payer: Self-pay | Admitting: *Deleted

## 2016-10-10 DIAGNOSIS — N760 Acute vaginitis: Principal | ICD-10-CM

## 2016-10-10 DIAGNOSIS — B9689 Other specified bacterial agents as the cause of diseases classified elsewhere: Secondary | ICD-10-CM

## 2016-10-10 MED ORDER — METRONIDAZOLE 500 MG PO TABS: 500.0000 mg | ORAL_TABLET | Freq: Two times a day (BID) | ORAL | 0 refills | Status: DC

## 2016-10-10 NOTE — Telephone Encounter (Signed)
Per Dr. Macon LargeAnyanwu, pt has +BV and needs to be notified of results and treatment. Rx has been prescribed however, no pharmacy is on file and cannot be sent. Please obtain information from pt and send Rx. I called pt with [employer] interpreter # [phone] and left message statign that I am calling with non-urgent test result information. A prescription is needed. Please call back and leave the information of your pharmacy on the nurse voice mail.

## 2016-10-10 NOTE — Progress Notes (Signed)
Thank you :)

## 2016-10-10 NOTE — Addendum Note (Signed)
Addended by: Jaynie CollinsANYANWU, Jasmon Mattice A on: 10/10/2016 08:12 AM   Modules accepted: Orders

## 2016-10-13 MED ORDER — METRONIDAZOLE 500 MG PO TABS: 500.0000 mg | ORAL_TABLET | Freq: Two times a day (BID) | ORAL | 0 refills | Status: DC

## 2016-10-13 NOTE — Telephone Encounter (Signed)
Called patient and informed her of +bv, prescription should be available today at her chosen pharmacy. Understanding voiced.

## 2016-10-13 NOTE — Telephone Encounter (Signed)
Patient called back, left message stating [facility] [address]. Updated information in epic and ordered med.

## 2017-09-15 NOTE — L&D Delivery Note (Addendum)
Patient is 27 y.o. [id number] 3929w0d admitted for IOL for post dates, hx of maternal obesity, varicella non-immune.   Delivery Note At 5:44 PM a viable female was delivered via Vaginal, Spontaneous (Presentation: cephalic; LOA ).  APGAR: 9, 10;  Placenta status: spontaneous, intact.  Cord: 3VC    Anesthesia:  No epidural Episiotomy: None Lacerations: No perineal lacerations, right labial laceration  Suture Repair: none Est. Blood Loss (mL): 100  Mom to postpartum.  Baby to Couplet care / Skin to Skin.  Upon arrival patient was complete and pushing. She pushed with good maternal effort to deliver a healthy baby girl. Baby delivered without difficulty, was noted to have good tone and place on maternal abdomen for oral suctioning, drying and stimulation. Delayed cord clamping performed. Placenta delivered intact with 3V cord. Vaginal canal and perineum was inspected and found to have a laceration of the right vaginal wall - hemostatic, no repair was necessary. Pitocin was started and uterus massaged until bleeding slowed. Counts of sharps, instruments, and lap pads were all correct.   Mirian MoPeter Frank, MD PGY-1 8/26/20196:04 PM   Attestation: I have seen this patient and agree with the resident's documentation. I was present for the delivery.   Cristal DeerLaurel S. Earlene PlaterWallace, DO OB/GYN Fellow

## 2017-09-28 IMAGING — US US OB TRANSVAGINAL
1 series · 15 of 28 positions shown · non-contrast
Comparison: None.

CLINICAL DATA: Bleeding

EXAM:
TRANSVAGINAL OB ULTRASOUND
TECHNIQUE: Transvaginal ultrasound was performed for complete evaluation of the
gestation as well as the maternal uterus, adnexal regions, and
pelvic cul-de-sac.

[Series 1: us ob transvaginal · 38 acquisitions, 15 frames shown]
[im 1/38]
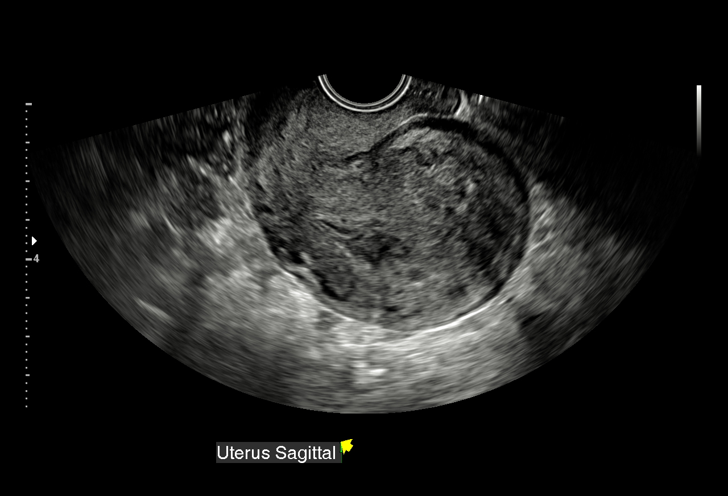
[im 3/38]
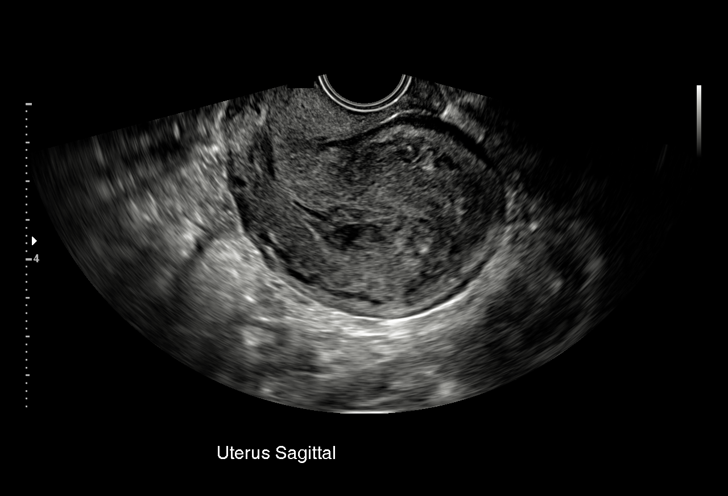
[im 6/38]
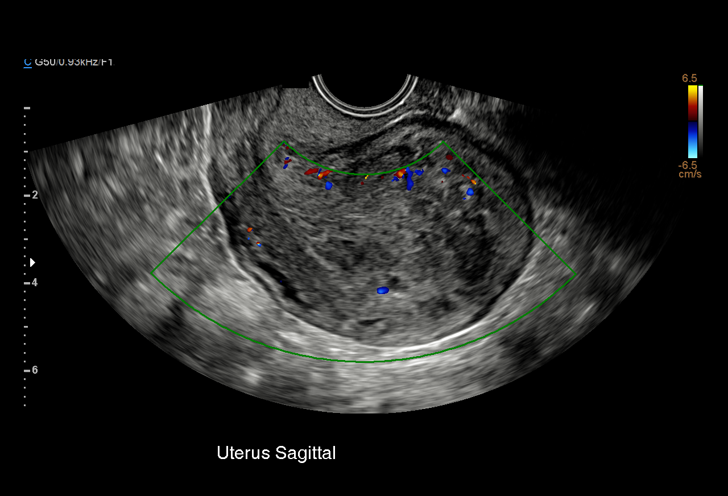
[im 9/38]
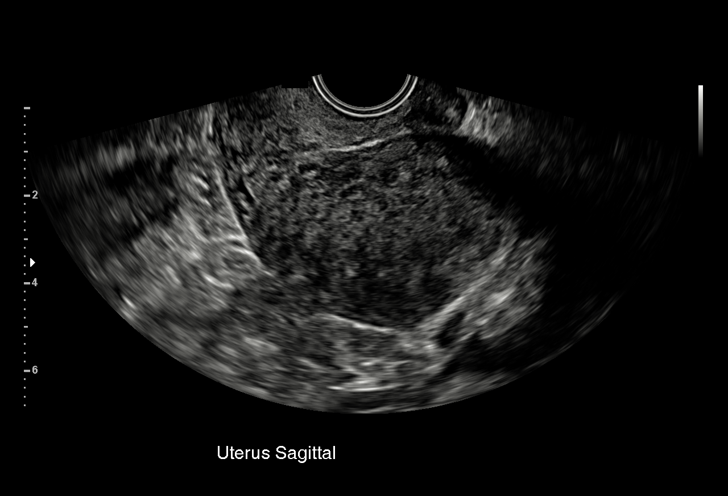
[im 11/38]
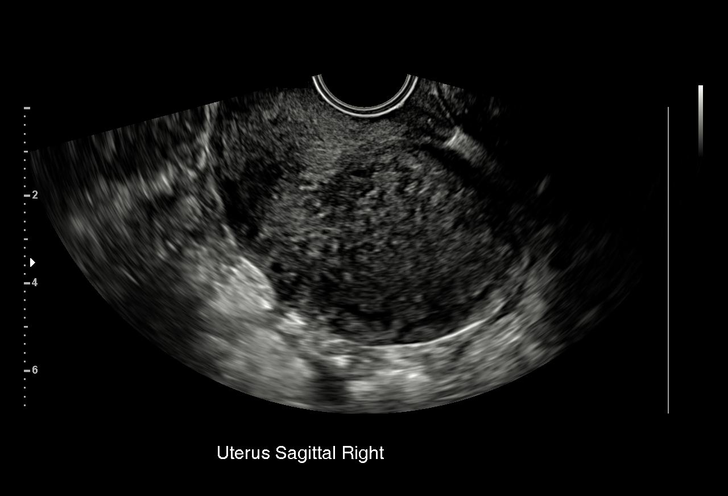
[im 14/38]
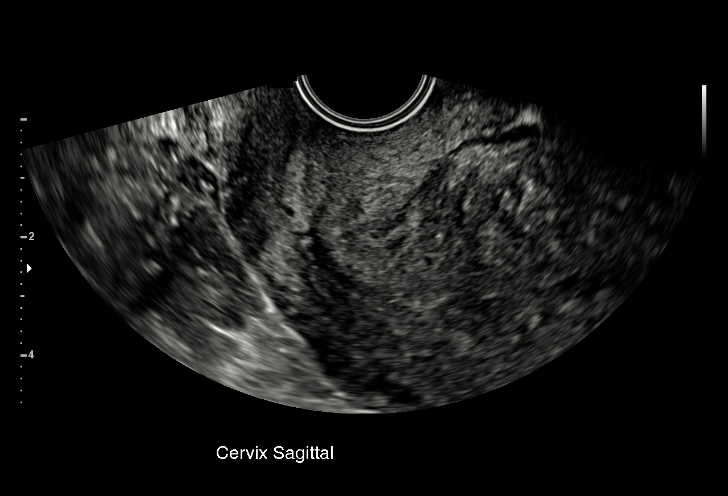
[im 17/38]
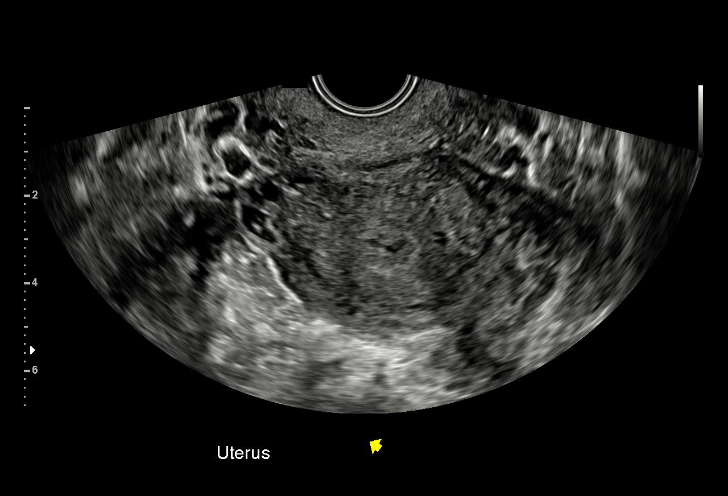
[im 20/38]
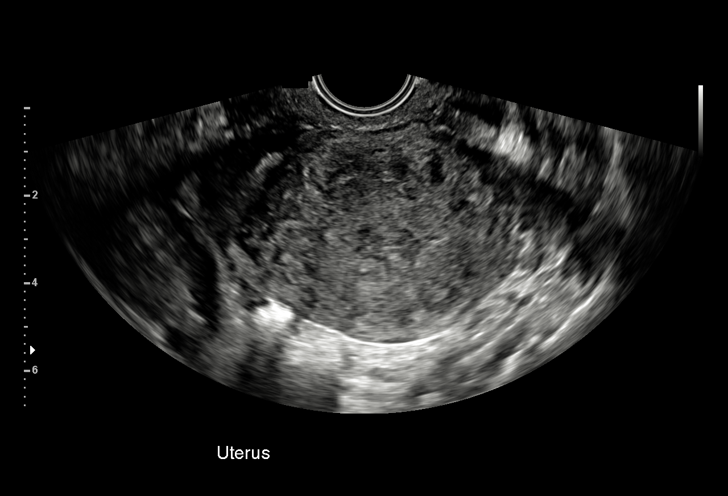
[im 21/38]
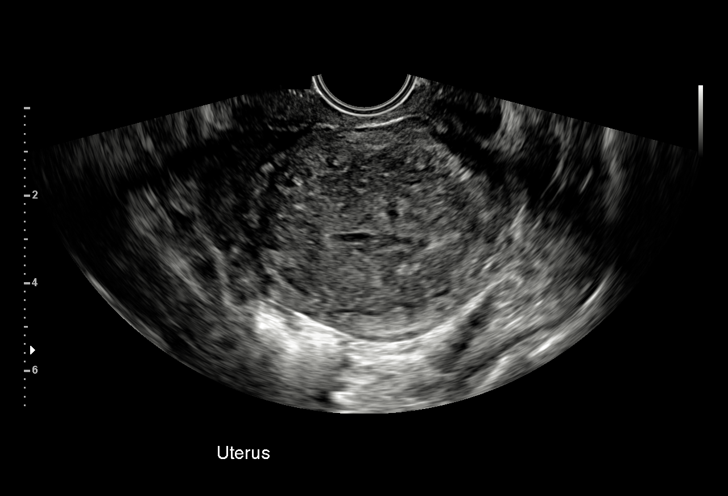
[im 24/38]
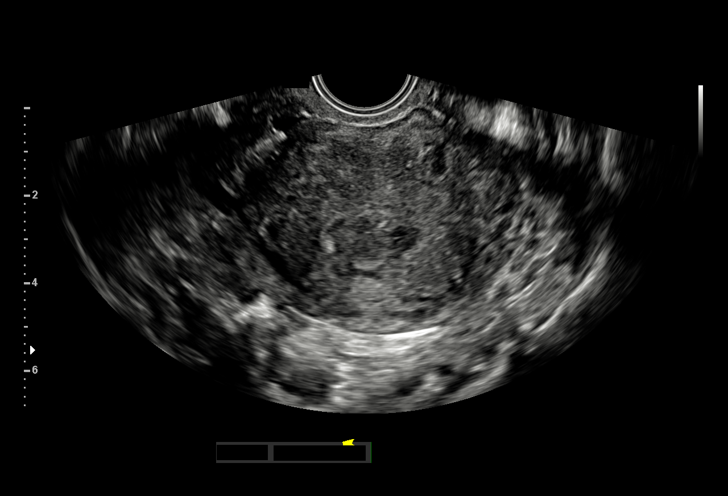
[im 27/38]
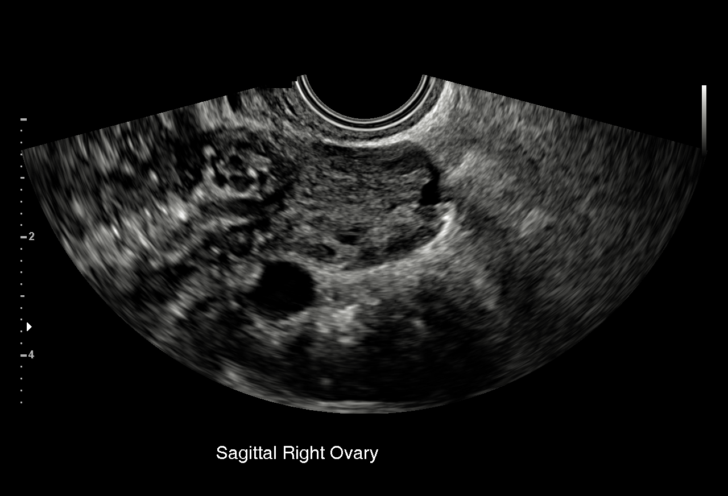
[im 29/38]
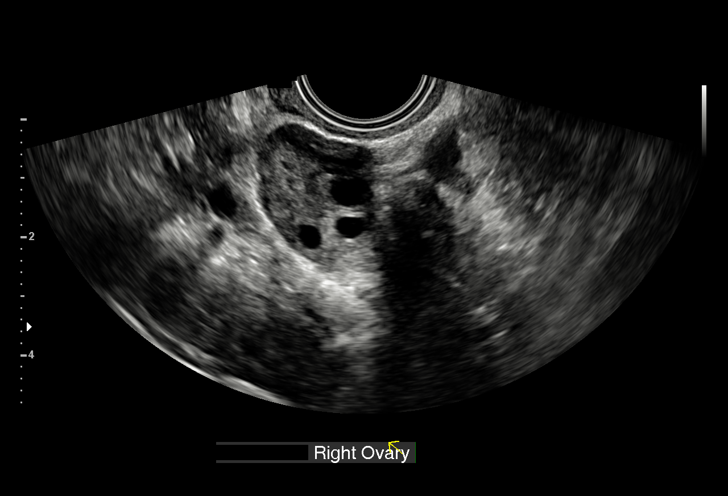
[im 32/38]
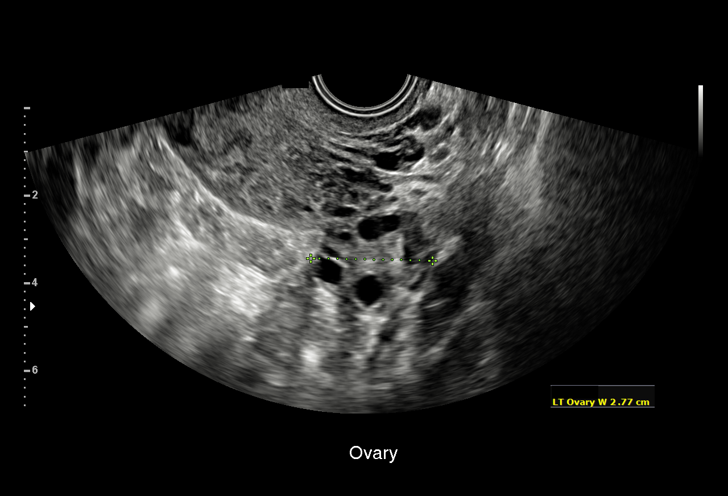
[im 35/38]
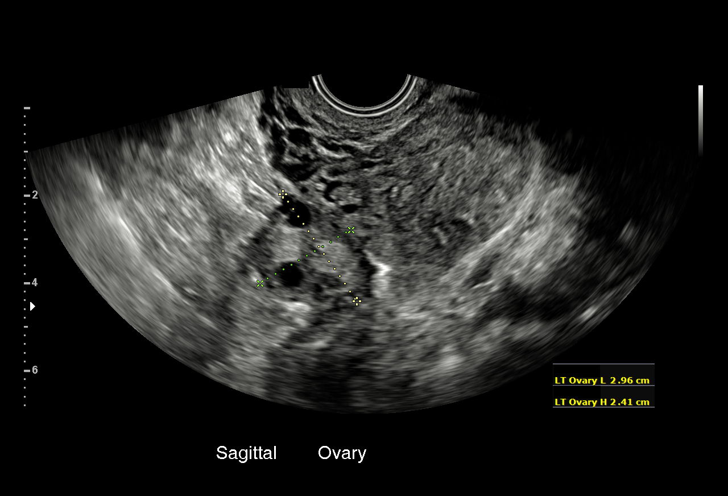
[im 38/38]
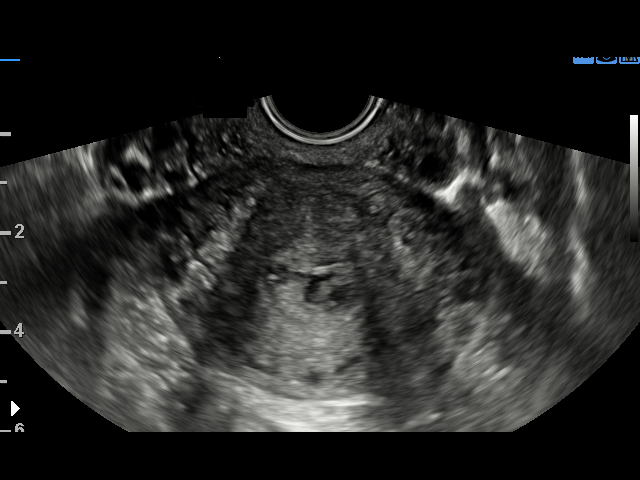

[15 of 28 positions shown; findings below may reference images not displayed]

FINDINGS: Intrauterine gestational sac: None visualized

Yolk sac:  None visualized

Embryo:  None visualized

Cardiac Activity:

Heart Rate:  bpm

MSD:   mm    w     d

CRL:     mm    w  d                  US EDC:

Subchorionic hemorrhage:  None visualized.

Maternal uterus/adnexae: No adnexal masses or free fluid. Thickened,
heterogeneous endometrium
IMPRESSION: No intrauterine pregnancy visualized. Differential considerations
would include early intrauterine pregnancy too early to visualize,
spontaneous abortion, or occult ectopic pregnancy. Recommend close
clinical followup and serial quantitative beta HCGs and ultrasounds.

## 2017-11-26 LAB — OB RESULTS CONSOLE HEPATITIS B SURFACE ANTIGEN: HEP B S AG: NEGATIVE

## 2017-11-26 LAB — OB RESULTS CONSOLE GC/CHLAMYDIA
Chlamydia: NEGATIVE
Gonorrhea: NEGATIVE

## 2017-11-26 LAB — OB RESULTS CONSOLE HIV ANTIBODY (ROUTINE TESTING): HIV: NONREACTIVE

## 2017-11-26 LAB — OB RESULTS CONSOLE RUBELLA ANTIBODY, IGM: Rubella: IMMUNE

## 2017-11-26 LAB — OB RESULTS CONSOLE ABO/RH: RH Type: POSITIVE

## 2017-11-26 LAB — OB RESULTS CONSOLE RPR: RPR: NONREACTIVE

## 2017-11-26 LAB — OB RESULTS CONSOLE ANTIBODY SCREEN: Antibody Screen: NEGATIVE

## 2018-04-08 LAB — OB RESULTS CONSOLE GC/CHLAMYDIA
Chlamydia: NEGATIVE
GC PROBE AMP, GENITAL: NEGATIVE

## 2018-04-08 LAB — OB RESULTS CONSOLE GBS: GBS: POSITIVE

## 2018-05-05 ENCOUNTER — Other Ambulatory Visit: Payer: Self-pay | Admitting: Advanced Practice Midwife

## 2018-05-06 ENCOUNTER — Encounter (HOSPITAL_COMMUNITY): Payer: Self-pay | Admitting: *Deleted

## 2018-05-06 ENCOUNTER — Telehealth (HOSPITAL_COMMUNITY): Payer: Self-pay | Admitting: *Deleted

## 2018-05-06 NOTE — Telephone Encounter (Signed)
Preadmission screen Interpreter number [id number]

## 2018-05-06 NOTE — Telephone Encounter (Signed)
Preadmission screen  

## 2018-05-10 ENCOUNTER — Other Ambulatory Visit: Payer: Self-pay

## 2018-05-10 ENCOUNTER — Inpatient Hospital Stay (HOSPITAL_COMMUNITY)
Admission: AD | Admit: 2018-05-10 | Discharge: 2018-05-12 | DRG: 807 | Disposition: A | Discharge status: Home / Self Care | Payer: Medicaid Other | Attending: Obstetrics and Gynecology | Admitting: Obstetrics and Gynecology

## 2018-05-10 ENCOUNTER — Encounter (HOSPITAL_COMMUNITY): Payer: Self-pay

## 2018-05-10 DIAGNOSIS — O480 Post-term pregnancy: Secondary | ICD-10-CM | POA: Diagnosis present

## 2018-05-10 DIAGNOSIS — O99824 Streptococcus B carrier state complicating childbirth: Secondary | ICD-10-CM | POA: Diagnosis present

## 2018-05-10 DIAGNOSIS — O99214 Obesity complicating childbirth: Secondary | ICD-10-CM | POA: Diagnosis present

## 2018-05-10 DIAGNOSIS — E669 Obesity, unspecified: Secondary | ICD-10-CM | POA: Diagnosis present

## 2018-05-10 DIAGNOSIS — Z3A41 41 weeks gestation of pregnancy: Secondary | ICD-10-CM

## 2018-05-10 HISTORY — DX: Other specified health status: Z78.9

## 2018-05-10 LAB — CBC
HCT: 41.4 % (ref 36.0–46.0)
Hemoglobin: 14.1 g/dL (ref 12.0–15.0)
MCH: 31.4 pg (ref 26.0–34.0)
MCHC: 34.1 g/dL (ref 30.0–36.0)
MCV: 92.2 fL (ref 78.0–100.0)
PLATELETS: 200 10*3/uL (ref 150–400)
RBC: 4.49 MIL/uL (ref 3.87–5.11)
RDW: 13.2 % (ref 11.5–15.5)
WBC: 12 10*3/uL — AB (ref 4.0–10.5)

## 2018-05-10 LAB — TYPE AND SCREEN
ABO/RH(D): O POS
Antibody Screen: NEGATIVE

## 2018-05-10 MED ORDER — DIPHENHYDRAMINE HCL 25 MG PO CAPS: 25.0000 mg | ORAL_CAPSULE | Freq: Four times a day (QID) | ORAL | Status: DC | PRN

## 2018-05-10 MED ORDER — OXYCODONE-ACETAMINOPHEN 5-325 MG PO TABS: 1.0000 | ORAL_TABLET | ORAL | Status: DC | PRN

## 2018-05-10 MED ORDER — ONDANSETRON HCL 4 MG PO TABS: 4.0000 mg | ORAL_TABLET | ORAL | Status: DC | PRN

## 2018-05-10 MED ORDER — LACTATED RINGERS IV SOLN: 500.0000 mL | INTRAVENOUS | Status: DC | PRN

## 2018-05-10 MED ORDER — ZOLPIDEM TARTRATE 5 MG PO TABS: 5.0000 mg | ORAL_TABLET | Freq: Every evening | ORAL | Status: DC | PRN

## 2018-05-10 MED ORDER — SOD CITRATE-CITRIC ACID 500-334 MG/5ML PO SOLN: 30.0000 mL | ORAL | Status: DC | PRN

## 2018-05-10 MED ORDER — PENICILLIN G 3 MILLION UNITS IVPB - SIMPLE MED
3.0000 10*6.[IU] | INTRAVENOUS | Status: DC
  Filled 2018-05-10 (×2): qty 100

## 2018-05-10 MED ORDER — ACETAMINOPHEN 325 MG PO TABS: 650.0000 mg | ORAL_TABLET | ORAL | Status: DC | PRN

## 2018-05-10 MED ORDER — LACTATED RINGERS IV SOLN
INTRAVENOUS | Status: DC
  Administered 2018-05-10: 16:00:00 via INTRAVENOUS

## 2018-05-10 MED ORDER — OXYTOCIN 40 UNITS IN LACTATED RINGERS INFUSION - SIMPLE MED
2.5000 [IU]/h | INTRAVENOUS | Status: DC
  Filled 2018-05-10: qty 1000

## 2018-05-10 MED ORDER — TERBUTALINE SULFATE 1 MG/ML IJ SOLN
0.2500 mg | Freq: Once | INTRAMUSCULAR | Status: DC | PRN
  Filled 2018-05-10: qty 1

## 2018-05-10 MED ORDER — DIBUCAINE 1 % RE OINT: 1.0000 "application " | TOPICAL_OINTMENT | RECTAL | Status: DC | PRN

## 2018-05-10 MED ORDER — OXYTOCIN 40 UNITS IN LACTATED RINGERS INFUSION - SIMPLE MED
1.0000 m[IU]/min | INTRAVENOUS | Status: DC
  Administered 2018-05-10: 4 m[IU]/min via INTRAVENOUS
  Administered 2018-05-10: 2 m[IU]/min via INTRAVENOUS

## 2018-05-10 MED ORDER — OXYTOCIN BOLUS FROM INFUSION
500.0000 mL | Freq: Once | INTRAVENOUS | Status: AC
  Administered 2018-05-10: 500 mL via INTRAVENOUS

## 2018-05-10 MED ORDER — IBUPROFEN 600 MG PO TABS
600.0000 mg | ORAL_TABLET | Freq: Four times a day (QID) | ORAL | Status: DC
  Administered 2018-05-11 – 2018-05-12 (×7): 600 mg via ORAL
  Filled 2018-05-10 (×7): qty 1

## 2018-05-10 MED ORDER — LIDOCAINE HCL (PF) 1 % IJ SOLN
30.0000 mL | INTRAMUSCULAR | Status: DC | PRN
  Filled 2018-05-10: qty 30

## 2018-05-10 MED ORDER — FENTANYL CITRATE (PF) 100 MCG/2ML IJ SOLN
100.0000 ug | INTRAMUSCULAR | Status: DC | PRN
  Filled 2018-05-10: qty 2

## 2018-05-10 MED ORDER — WITCH HAZEL-GLYCERIN EX PADS: 1.0000 "application " | MEDICATED_PAD | CUTANEOUS | Status: DC | PRN

## 2018-05-10 MED ORDER — MISOPROSTOL 50MCG HALF TABLET: 50.0000 ug | ORAL_TABLET | ORAL | Status: DC | PRN

## 2018-05-10 MED ORDER — SODIUM CHLORIDE 0.9 % IV SOLN
5.0000 10*6.[IU] | Freq: Once | INTRAVENOUS | Status: AC
  Administered 2018-05-10: 5 10*6.[IU] via INTRAVENOUS
  Filled 2018-05-10: qty 5

## 2018-05-10 MED ORDER — OXYCODONE-ACETAMINOPHEN 5-325 MG PO TABS: 2.0000 | ORAL_TABLET | ORAL | Status: DC | PRN

## 2018-05-10 MED ORDER — BENZOCAINE-MENTHOL 20-0.5 % EX AERO: 1.0000 "application " | INHALATION_SPRAY | CUTANEOUS | Status: DC | PRN

## 2018-05-10 MED ORDER — COCONUT OIL OIL: 1.0000 "application " | TOPICAL_OIL | Status: DC | PRN

## 2018-05-10 MED ORDER — VARICELLA VIRUS VACCINE LIVE 1350 PFU/0.5ML IJ SUSR: 0.5000 mL | Freq: Once | INTRAMUSCULAR | Status: DC

## 2018-05-10 MED ORDER — ONDANSETRON HCL 4 MG/2ML IJ SOLN: 4.0000 mg | INTRAMUSCULAR | Status: DC | PRN

## 2018-05-10 MED ORDER — PRENATAL MULTIVITAMIN CH
1.0000 | ORAL_TABLET | Freq: Every day | ORAL | Status: DC
  Administered 2018-05-11 – 2018-05-12 (×2): 1 via ORAL
  Filled 2018-05-10 (×2): qty 1

## 2018-05-10 MED ORDER — TETANUS-DIPHTH-ACELL PERTUSSIS 5-2.5-18.5 LF-MCG/0.5 IM SUSP: 0.5000 mL | Freq: Once | INTRAMUSCULAR | Status: DC

## 2018-05-10 MED ORDER — ONDANSETRON HCL 4 MG/2ML IJ SOLN: 4.0000 mg | Freq: Four times a day (QID) | INTRAMUSCULAR | Status: DC | PRN

## 2018-05-10 MED ORDER — SIMETHICONE 80 MG PO CHEW: 80.0000 mg | CHEWABLE_TABLET | ORAL | Status: DC | PRN

## 2018-05-10 MED ORDER — SENNOSIDES-DOCUSATE SODIUM 8.6-50 MG PO TABS
2.0000 | ORAL_TABLET | ORAL | Status: DC
  Administered 2018-05-11 (×2): 2 via ORAL
  Filled 2018-05-10 (×2): qty 2

## 2018-05-10 NOTE — MAU Note (Signed)
Urine sent to lab 

## 2018-05-10 NOTE — H&P (Addendum)
OBSTETRIC ADMISSION HISTORY AND PHYSICAL  Sheryl Carroll is a 27 y.o. female [phone] with IUP at [redacted]w[redacted]d by L/16 presenting for IOL for post dates. She has a history of one miscarriage. She reports +FMs, No LOF, no VB, no blurry vision, headaches or peripheral edema, and RUQ pain.  She request nexplanon for birth control. She received her prenatal [facility]   Pt presented to the MAU with painful contractions with a planned induction scheduled for midnight on the same day.  Dating: By L/16 --->  Estimated Date of Delivery: 05/03/18  Prenatal History/Complications:  obesity  Past Medical History: Past Medical History:  Diagnosis Date  . Medical history non-contributory     Past Surgical History: Past Surgical History:  Procedure Laterality Date  . NO PAST SURGERIES      Obstetrical History: OB History    Gravida  3   Para  2   Term  2   Preterm  0   AB  0   Living  2     SAB  0   TAB  0   Ectopic  0   Multiple      Live Births              Social History: Social History   Socioeconomic History  . Marital status: Single    Spouse name: Not on file  . Number of children: Not on file  . Years of education: Not on file  . Highest education level: Not on file  Occupational History  . Not on file  Social Needs  . Financial resource strain: Not on file  . Food insecurity:    Worry: Not on file    Inability: Not on file  . Transportation needs:    Medical: Not on file    Non-medical: Not on file  Tobacco Use  . Smoking status: Never Smoker  . Smokeless tobacco: Never Used  Substance and Sexual Activity  . Alcohol use: No  . Drug use: Never  . Sexual activity: Yes  Lifestyle  . Physical activity:    Days per week: Not on file    Minutes per session: Not on file  . Stress: Not on file  Relationships  . Social connections:    Talks on phone: Not on file    Gets together: Not on file    Attends religious service: Not on file     Active member of club or organization: Not on file    Attends meetings of clubs or organizations: Not on file    Relationship status: Not on file  Other Topics Concern  . Not on file  Social History Narrative   ** Merged History Encounter **        Family History: History reviewed. No pertinent family history.  Allergies: No Known Allergies  Medications Prior to Admission  Medication Sig Dispense Refill Last Dose  . metroNIDAZOLE (FLAGYL) 500 MG tablet Take 1 tablet (500 mg total) by mouth 2 (two) times daily. 14 tablet 0      Review of Systems   All systems reviewed and negative except as stated in HPI  Blood pressure 133/81, pulse 88, temperature 98.9 F (37.2 C), temperature source Oral, resp. rate 17, weight 112.7 kg, SpO2 100 %. General appearance: alert, cooperative, appears stated age and no distress Lungs: clear to auscultation bilaterally Heart: regular rate and rhythm Abdomen: soft, non-tender; bowel sounds normal Pelvic: see below Extremities: Homans sign is negative, no sign of  DVT Presentation: cephalic Fetal monitoringBaseline: 140 bpm, Variability: Good {> 6 bpm), Accelerations: Reactive and Decelerations: Absent Uterine activityFrequency: Every 5 minutes Dilation: 4 Effacement (%): 90 Station: -1, -2 Exam by:: B. Bowen, RN    Prenatal labs: ABO, Rh: O/Positive/-- (03/14 0000) Antibody: Negative (03/14 0000) Rubella: Immune (03/14 0000) RPR: Nonreactive (03/14 0000)  HBsAg: Negative (03/14 0000)  HIV: Non-reactive (03/14 0000)  GBS: Positive (07/25 0000)  Varicella: non-immune 1 hr Glucola 61 Genetic screening  negative  Prenatal Transfer Tool  Maternal Diabetes: No Genetic Screening: Normal Maternal Ultrasounds/Referrals: Normal Fetal Ultrasounds or other Referrals:  None Maternal Substance Abuse:  No Significant Maternal Medications:  None Significant Maternal Lab Results: Lab values include: Group B Strep positive, varicella  non-immune  No results found for this or any previous visit (from the past 24 hour(s)).  Patient Active Problem List   Diagnosis Date Noted  . Post term pregnancy over 40 weeks 05/10/2018    Assessment/Plan:  Sheryl Carroll is a 27 y.o. G3P2002 at 133w0d here for IOL for post dates.  #Labor: IOL on Pit #Pain: Does not want an epidural #FWB: Category I #ID: GBS(+) PCN #MOF: breast/bottle #MOC:Nexplanon  Mirian MoPeter Frank, MD  05/10/2018, 3:37 PM  Attestation: I have seen this patient and agree with the resident's documentation. I have examined them separately, and we have discussed the plan of care.   Cristal DeerLaurel S. Earlene PlaterWallace, DO OB/GYN Fellow

## 2018-05-10 NOTE — Anesthesia Pain Management Evaluation Note (Signed)
  CRNA Pain Management Visit Note  Patient: Sheryl Carroll, 27 y.o., female  "Hello [facility]. We have an anesthesia team available at all times to provide care throughout the hospital, including epidural management and anesthesia for C-section. I don't know your plan for the delivery whether it a natural birth, water birth, IV sedation, nitrous supplementation, doula or epidural, but we want to meet your pain goals."   1.Was your pain managed to your expectations on prior hospitalizations?   Yes   2.What is your expectation for pain management during this hospitalization?     Labor support without medications  3.How can we help you reach that goal? natural  Record the patient's initial score and the patient's pain goal.   Pain: 6  Pain Goal: 10 [facility] wants you to be able to say your pain was always managed very well.  Christianne Zacher 05/10/2018

## 2018-05-10 NOTE — MAU Note (Signed)
Contracting, started this morning, getting closer and stronger. Small amt of blood/mucous. No water leaking.

## 2018-05-11 ENCOUNTER — Inpatient Hospital Stay (HOSPITAL_COMMUNITY): Admission: RE | Admit: 2018-05-11 | Payer: Self-pay | Source: Ambulatory Visit

## 2018-05-11 LAB — CBC
HEMATOCRIT: 38 % (ref 36.0–46.0)
HEMOGLOBIN: 12.8 g/dL (ref 12.0–15.0)
MCH: 31.2 pg (ref 26.0–34.0)
MCHC: 33.7 g/dL (ref 30.0–36.0)
MCV: 92.7 fL (ref 78.0–100.0)
Platelets: 189 10*3/uL (ref 150–400)
RBC: 4.1 MIL/uL (ref 3.87–5.11)
RDW: 13.3 % (ref 11.5–15.5)
WBC: 14 10*3/uL — ABNORMAL HIGH (ref 4.0–10.5)

## 2018-05-11 LAB — RPR: RPR: NONREACTIVE

## 2018-05-11 NOTE — Lactation Note (Signed)
This note was copied from a baby's chart. Lactation Consultation Note  Patient Name: Girl Lacie Draftoemi Orozco-Sanchez ZOXWR'UToday's Date: 05/11/2018 Reason for consult: Initial assessment   Video interpreter used for Spanish.  P3, Baby 18 hours old and recently breastfed for 13 min. Reviewed hand expression with drops expressed. Mom encouraged to feed baby 8-12 times/24 hours and with feeding cues.   Mom made aware of O/P services, breastfeeding support groups, community resources, and our phone # for post-discharge questions in Spanish.     Maternal Data Has patient been taught Hand Expression?: Yes Does the patient have breastfeeding experience prior to this delivery?: Yes  Feeding Feeding Type: Breast Fed Length of feed: 13 min  LATCH Score                   Interventions Interventions: Hand express  Lactation Tools Discussed/Used     Consult Status Consult Status: Follow-up Date: 05/12/18 Follow-up type: In-patient    Dahlia ByesBerkelhammer, Ruth Shands [facility] 05/11/2018, 12:11 PM

## 2018-05-11 NOTE — Progress Notes (Signed)
Post Partum Day 1  Subjective:  Sheryl Carroll is a 27 y.o. G3P3003 3383w0d s/p SVD.  No acute events overnight.  Pt denies problems with ambulating, voiding or po intake.  She denies nausea or vomiting.  Pain is well controlled. She has not had bowel movement.  Lochia Small.  Plan for birth control is Nexplanon.  Method of Feeding: Breast and bottle.   Patient denies fever, chills, HA, blurry vision, CP, SOB, nausea, vomiting, diarrhea, changes in urination.   Objective: BP 125/72 (BP Location: Right Arm)   Pulse 85   Temp 98.8 F (37.1 C) (Oral)   Resp 17   Ht 5\' 1"  (1.549 m)   Wt 112 kg   SpO2 99%   Breastfeeding? Unknown   BMI 46.67 kg/m   Physical Exam:  General: alert, cooperative and no distress Lochia:normal flow Chest: CTAB Heart: RRR no m/r/g Abdomen: +BS, soft, nontender, fundus firm at/below umbilicus Uterine Fundus: firm, non-tender  DVT Evaluation: No evidence of DVT seen on physical exam. Extremities: no edema noted  Recent Labs    05/10/18 1544 05/11/18 0546  HGB 14.1 12.8  HCT 41.4 38.0    Assessment/Plan:  ASSESSMENT: Sheryl Carroll is a 27 y.o. G3P3003 7583w0d ppd #1 s/p NSVD doing well. Vitals have been within normal limits since delivery. No complaints at this time.   Plan for discharge tomorrow, Breastfeeding and Contraception Nexplanon after discharge    LOS: 1 day   Celedonio SavageJohn V Sven Pinheiro 05/11/2018, 11:07 AM

## 2018-05-12 MED ORDER — ACETAMINOPHEN 325 MG PO TABS: 650.0000 mg | ORAL_TABLET | ORAL | 0 refills | Status: DC | PRN

## 2018-05-12 MED ORDER — WITCH HAZEL-GLYCERIN EX PADS: 1.0000 "application " | MEDICATED_PAD | CUTANEOUS | 12 refills | Status: DC | PRN

## 2018-05-12 MED ORDER — BENZOCAINE-MENTHOL 20-0.5 % EX AERO: 1.0000 "application " | INHALATION_SPRAY | CUTANEOUS | 0 refills | Status: DC | PRN

## 2018-05-12 MED ORDER — IBUPROFEN 600 MG PO TABS: 600.0000 mg | ORAL_TABLET | Freq: Four times a day (QID) | ORAL | 0 refills | Status: DC

## 2018-05-12 NOTE — Discharge Summary (Addendum)
OB Discharge Summary     Patient Name: Sheryl Carroll DOB: 31-Oct-1990 MRN: [id number]  Date of admission: 05/10/2018 Delivering MD: Mirian MoFRANK, PETER   Date of discharge: 05/12/2018  Admitting diagnosis: 41WKS CTX 4-5MINS  Intrauterine pregnancy: 3529w0d     Secondary diagnosis:  Active Problems:   Post term pregnancy over 40 weeks  Additional problems: none   Discharge diagnosis: Term Pregnancy Delivered                                                Post partum procedures:.none Augmentation: Pitocin Complications: None  Hospital course:  Induction of Labor With Vaginal Delivery   27 y.o. yo G3P3003 at 7829w0d was admitted to the hospital 05/10/2018 for induction of labor.  Indication for induction: Postdates.  Patient had an uncomplicated labor course as follows: Membrane Rupture Time/Date: 5:22 PM ,05/10/2018   Intrapartum Procedures: Episiotomy: None [1]                                         Lacerations:  None [1]  Patient had delivery of a Viable infant.  Information for the patient's newborn:  Ralene BatheOrozco-Sanchez, Girl Maybel [[id number]][[id number]]  Delivery Method: Vag-Spont   05/10/2018  Details of delivery can be found in separate delivery note.  Patient had a routine postpartum course. Patient is discharged home 05/12/18.  Physical exam  Vitals:   05/11/18 1440 05/11/18 2245 05/12/18 0600 05/12/18 1423  BP: 118/74 122/62 130/68 120/67  Pulse: 72 68 69 73  Resp: 17 18 16 18   Temp: 98.3 F (36.8 C) 98.2 F (36.8 C) 97.7 F (36.5 C) 98.7 F (37.1 C)  TempSrc: Oral Oral Oral Oral  SpO2:    98%  Weight:      Height:       General: alert, cooperative and no distress Lochia: appropriate Uterine Fundus: firm Incision: n/a DVT Evaluation: No evidence of DVT seen on physical exam. Labs: Lab Results  Component Value Date   WBC 14.0 (H) 05/11/2018   HGB 12.8 05/11/2018   HCT 38.0 05/11/2018   MCV 92.7 05/11/2018   PLT 189 05/11/2018   CMP Latest Ref Rng & Units 08/16/2010   Glucose 70 - 99 mg/dL 86  BUN 6 - 23 mg/dL 6  Creatinine 0.4 - 1.2 mg/dL 5.280.49  Sodium 413135 - 244145 mEq/L 136  Potassium 3.5 - 5.1 mEq/L 4.1  Chloride 96 - 112 mEq/L 106  CO2 19 - 32 mEq/L 23  Calcium 8.4 - 10.5 mg/dL 9.6  Total Protein 6.0 - 8.3 g/dL 6.3  Total Bilirubin 0.3 - 1.2 mg/dL 0.6  Alkaline Phos 39 - 117 U/L 62  AST 0 - 37 U/L 18  ALT 0 - 35 U/L 15    Discharge instruction: per After Visit Summary and "Baby and Me Booklet".  After visit meds:  Allergies as of 05/12/2018   No Known Allergies     Medication List    STOP taking these medications   metroNIDAZOLE 500 MG tablet Commonly known as:  FLAGYL     TAKE these medications   benzocaine-Menthol 20-0.5 % Aero Commonly known as:  DERMOPLAST Apply 1 application topically as needed for irritation (perineal discomfort).   ibuprofen 600 MG tablet Commonly known as:  ADVIL,MOTRIN Take 1 tablet (600 mg total) by mouth every 6 (six) hours.   prenatal multivitamin Tabs tablet Take 1 tablet by mouth daily at 12 noon.       Diet: routine diet Activity: Advance as tolerated. Pelvic rest for 6 weeks.   Outpatient follow up: 4 weeks Follow up Appt:Will follow with HD, encouraged to call to make an appointment  Postpartum contraception: Nexplanon  Newborn Data: Live born female  Birth Weight: 7 lb 7.9 oz (3399 g) APGAR: 9, 10  Newborn Delivery   Birth date/time:  05/10/2018 17:44:00 Delivery type:  Vaginal, Spontaneous    Baby Feeding: both Disposition:home with mother  Attestation: I agree with the resident's documentation. I have separately examined the patient, and we have discussed the plan of care.   Cristal Deer. Earlene Plater, DO OB/GYN Fellow, Faculty Practice

## 2018-05-12 NOTE — Lactation Note (Signed)
This note was copied from a baby's chart. Lactation Consultation Note  Patient Name: Sheryl Carroll RUEAV'WToday's Date: 05/12/2018 Reason for consult: Follow-up assessment;Term  P3 mother whose infant is now 542 hours old.  Mother is anticipating discharge today.  Mother has no questions/concerns regarding breastfeeding.  She has no pain with latching and no breast tenderness or nipple soreness.  Hand pump given with instructions for use.  Engorgement prevention/treatment discussed with mother.  She has our OP phone number if needed.   Maternal Data Formula Feeding for Exclusion: No Has patient been taught Hand Expression?: Yes  Feeding Feeding Type: Formula Nipple Type: Slow - flow Length of feed: 10 min  LATCH Score                   Interventions    Lactation Tools Discussed/Used Pump Review: Setup, frequency, and cleaning;Milk Storage Initiated by:: Demetry Bendickson Date initiated:: 05/12/18   Consult Status Consult Status: Complete Date: 05/12/18 Follow-up type: In-patient    Ysela Hettinger R Nema Oatley 05/12/2018, 12:10 PM

## 2020-03-29 ENCOUNTER — Ambulatory Visit: Payer: Self-pay | Attending: Internal Medicine

## 2020-03-29 DIAGNOSIS — Z23 Encounter for immunization: Secondary | ICD-10-CM

## 2020-03-29 NOTE — Progress Notes (Signed)
   Covid-19 Vaccination Clinic  Name:  KIARRAH RAUSCH    MRN: [id number] DOB: 1991-04-18  03/29/2020  Ms. Orozco-Sanchez was observed post Covid-19 immunization for 15 minutes without incident. She was provided with Vaccine Information Sheet and instruction to access the V-Safe system.   Ms. San was instructed to call 911 with any severe reactions post vaccine: Marland Kitchen Difficulty breathing  . Swelling of face and throat  . A fast heartbeat  . A bad rash all over body  . Dizziness and weakness   Immunizations Administered    Name Date Dose VIS Date Route   Moderna COVID-19 Vaccine 03/29/2020  5:43 PM 0.5 mL 08/2019 Intramuscular   Manufacturer: Moderna   Lot: [id number]   NDC: 60156-153-79

## 2020-04-26 ENCOUNTER — Ambulatory Visit: Payer: Self-pay

## 2022-08-11 ENCOUNTER — Other Ambulatory Visit: Payer: Self-pay | Admitting: Family

## 2022-08-11 DIAGNOSIS — Z3689 Encounter for other specified antenatal screening: Secondary | ICD-10-CM

## 2022-08-11 LAB — OB RESULTS CONSOLE GC/CHLAMYDIA
Chlamydia: NEGATIVE
Neisseria Gonorrhea: NEGATIVE

## 2022-08-11 LAB — OB RESULTS CONSOLE ABO/RH: RH Type: POSITIVE

## 2022-08-11 LAB — OB RESULTS CONSOLE HGB/HCT, BLOOD
HCT: 38 (ref 29–41)
Hemoglobin: 12.4

## 2022-08-11 LAB — OB RESULTS CONSOLE RUBELLA ANTIBODY, IGM: Rubella: IMMUNE

## 2022-08-11 LAB — OB RESULTS CONSOLE HIV ANTIBODY (ROUTINE TESTING): HIV: NONREACTIVE

## 2022-08-11 LAB — OB RESULTS CONSOLE RPR: RPR: NONREACTIVE

## 2022-08-11 LAB — OB RESULTS CONSOLE VARICELLA ZOSTER ANTIBODY, IGG: Varicella: IMMUNE

## 2022-08-11 LAB — OB RESULTS CONSOLE PLATELET COUNT: Platelets: 233

## 2022-08-11 LAB — OB RESULTS CONSOLE HEPATITIS B SURFACE ANTIGEN: Hepatitis B Surface Ag: NEGATIVE

## 2022-08-11 LAB — OB RESULTS CONSOLE ANTIBODY SCREEN: Antibody Screen: NEGATIVE

## 2022-09-15 NOTE — L&D Delivery Note (Signed)
Labor Course: Sheryl Carroll is a 32 y.o. female [phone] with IUP at [redacted]w[redacted]d admitted for IOL for CHTN .  She progressed with IP Foley, Cytotec and AROM for augmentation to complete and pushed ~ 10 minutes to deliver.  Cord clamping delayed by 1-3 minutes then clamped by CNM and cut by pt husband.  Placenta intact and spontaneous, bleeding minimal.  No laceration or repair.  Mom and baby stable prior to transfer to postpartum. She plans on breast and formula feeding. She is undecided about BTL vs LARC for contraception.   Delivery Note At 4:53 AM a viable and healthy female was delivered via Vaginal, Spontaneous (Presentation: Left Occiput Anterior).  APGAR: 7, 9; weight  pending.   Placenta status: Spontaneous, Intact.  Cord: 3 vessels with the following complications: None.    Anesthesia: None Episiotomy: None Lacerations: None Suture Repair:  none Est. Blood Loss (mL): 251  Mom to postpartum.  Baby to Couplet care / Skin to Skin.  [name] 11/29/2022, 5:51 AM

## 2022-09-16 ENCOUNTER — Ambulatory Visit (INDEPENDENT_AMBULATORY_CARE_PROVIDER_SITE_OTHER): Payer: Self-pay | Admitting: Obstetrics and Gynecology

## 2022-09-16 ENCOUNTER — Encounter: Payer: Self-pay | Admitting: Obstetrics and Gynecology

## 2022-09-16 VITALS — BP 129/75 | HR 84 | Wt 275.3 lb

## 2022-09-16 DIAGNOSIS — O99820 Streptococcus B carrier state complicating pregnancy: Secondary | ICD-10-CM | POA: Diagnosis not present

## 2022-09-16 DIAGNOSIS — Z789 Other specified health status: Secondary | ICD-10-CM | POA: Diagnosis not present

## 2022-09-16 DIAGNOSIS — Z3483 Encounter for supervision of other normal pregnancy, third trimester: Secondary | ICD-10-CM

## 2022-09-16 DIAGNOSIS — Z3A28 28 weeks gestation of pregnancy: Secondary | ICD-10-CM

## 2022-09-16 DIAGNOSIS — O169 Unspecified maternal hypertension, unspecified trimester: Secondary | ICD-10-CM | POA: Insufficient documentation

## 2022-09-16 DIAGNOSIS — I10 Essential (primary) hypertension: Secondary | ICD-10-CM | POA: Insufficient documentation

## 2022-09-16 DIAGNOSIS — Z6841 Body Mass Index (BMI) 40.0 and over, adult: Secondary | ICD-10-CM

## 2022-09-16 DIAGNOSIS — O163 Unspecified maternal hypertension, third trimester: Secondary | ICD-10-CM

## 2022-09-16 DIAGNOSIS — Z23 Encounter for immunization: Secondary | ICD-10-CM

## 2022-09-16 DIAGNOSIS — O10913 Unspecified pre-existing hypertension complicating pregnancy, third trimester: Secondary | ICD-10-CM | POA: Diagnosis not present

## 2022-09-16 NOTE — Addendum Note (Signed)
Addended by: Forrestine Him A on: 09/16/2022 03:57 PM   Modules accepted: Orders

## 2022-09-16 NOTE — Progress Notes (Signed)
   PRENATAL VISIT NOTE  Subjective:  Sheryl Carroll is a 32 y.o. [id number] at [redacted]w[redacted]d being seen today for ongoing prenatal care.  She is currently monitored for the following issues for this high-risk pregnancy and has Post term pregnancy over 40 weeks; Encounter for supervision of other normal pregnancy, third trimester; Hypertension complicating pregnancy; and BMI 50.0-59.9, adult ([place]) on their problem list.  Patient doing well with no acute concerns today. She reports no complaints.  Contractions: Not present. Vag. Bleeding: None.  Movement: Present. Denies leaking of fluid.   The following portions of the patient's history were reviewed and updated as appropriate: allergies, current medications, past family history, past medical history, past social history, past surgical history and problem list. Problem list updated.  Objective:   Vitals:   09/16/22 1446  BP: 129/75  Pulse: 84  Weight: 275 lb 4.8 oz (124.9 kg)    Fetal Status: Fetal Heart Rate (bpm): 134 Fundal Height: 29 cm Movement: Present     General:  Alert, oriented and cooperative. Patient is in no acute distress.  Skin: Skin is warm and dry. No rash noted.   Cardiovascular: Normal heart rate noted  Respiratory: Normal respiratory effort, no problems with respiration noted  Abdomen: Soft, gravid, appropriate for gestational age.  Pain/Pressure: Absent     Pelvic: Cervical exam deferred        Extremities: Normal range of motion.  Edema: None  Mental Status:  Normal mood and affect. Normal behavior. Normal judgment and thought content.   Assessment and Plan:  Pregnancy: G5P3013 at [redacted]w[redacted]d  1. Encounter for supervision of other normal pregnancy, third trimester Continue routine prenatal care Pt desires BTL, she has an insurance card which appears to have blue cross/ blue shield and is not a medicaid product.  2. [redacted] weeks gestation of pregnancy   3. Hypertension affecting pregnancy in third trimester Pt has  growth scan scheduled with MFM Request placed for weekly testing starting at 32 weeks  4. BMI 50.0-59.9, adult ([place])   Preterm labor symptoms and general obstetric precautions including but not limited to vaginal bleeding, contractions, leaking of fluid and fetal movement were reviewed in detail with the patient.  Please refer to After Visit Summary for other counseling recommendations.   Return in about 2 weeks (around 09/30/2022) for [facility], in person.   Lynnda Shields, MD Faculty Attending Center for [facility]

## 2022-09-23 ENCOUNTER — Other Ambulatory Visit: Payer: Self-pay | Admitting: Obstetrics & Gynecology

## 2022-09-23 ENCOUNTER — Other Ambulatory Visit: Payer: Self-pay | Admitting: Obstetrics and Gynecology

## 2022-09-23 DIAGNOSIS — Z363 Encounter for antenatal screening for malformations: Secondary | ICD-10-CM

## 2022-09-24 ENCOUNTER — Telehealth: Payer: Self-pay

## 2022-09-24 NOTE — Telephone Encounter (Signed)
Left message for patient to call [facility] to reschedule Ultrasound appointment from Friday to Thursday

## 2022-09-26 ENCOUNTER — Ambulatory Visit: Payer: BLUE CROSS/BLUE SHIELD | Admitting: *Deleted

## 2022-09-26 ENCOUNTER — Ambulatory Visit: Payer: BLUE CROSS/BLUE SHIELD | Attending: Maternal & Fetal Medicine

## 2022-09-26 ENCOUNTER — Other Ambulatory Visit: Payer: Self-pay | Admitting: *Deleted

## 2022-09-26 VITALS — BP 138/84 | HR 106

## 2022-09-26 DIAGNOSIS — Z3A38 38 weeks gestation of pregnancy: Secondary | ICD-10-CM | POA: Insufficient documentation

## 2022-09-26 DIAGNOSIS — O10913 Unspecified pre-existing hypertension complicating pregnancy, third trimester: Secondary | ICD-10-CM

## 2022-09-26 DIAGNOSIS — Z3483 Encounter for supervision of other normal pregnancy, third trimester: Secondary | ICD-10-CM

## 2022-09-26 DIAGNOSIS — O99213 Obesity complicating pregnancy, third trimester: Secondary | ICD-10-CM | POA: Insufficient documentation

## 2022-09-26 DIAGNOSIS — Z363 Encounter for antenatal screening for malformations: Secondary | ICD-10-CM | POA: Diagnosis present

## 2022-10-01 ENCOUNTER — Encounter: Payer: Self-pay | Admitting: Obstetrics and Gynecology

## 2022-10-01 ENCOUNTER — Other Ambulatory Visit: Payer: Self-pay | Admitting: *Deleted

## 2022-10-01 DIAGNOSIS — O0932 Supervision of pregnancy with insufficient antenatal care, second trimester: Secondary | ICD-10-CM

## 2022-10-01 DIAGNOSIS — O99212 Obesity complicating pregnancy, second trimester: Secondary | ICD-10-CM

## 2022-10-01 DIAGNOSIS — O10912 Unspecified pre-existing hypertension complicating pregnancy, second trimester: Secondary | ICD-10-CM

## 2022-10-01 LAB — CYTOLOGY - PAP: Pap: NEGATIVE

## 2022-10-01 LAB — GLUCOSE TOLERANCE, 1 HOUR: GTT, 1 hr: 105

## 2022-10-06 ENCOUNTER — Ambulatory Visit (INDEPENDENT_AMBULATORY_CARE_PROVIDER_SITE_OTHER): Payer: Self-pay | Admitting: Obstetrics and Gynecology

## 2022-10-06 VITALS — BP 135/79 | HR 76 | Wt 272.2 lb

## 2022-10-06 DIAGNOSIS — Z6841 Body Mass Index (BMI) 40.0 and over, adult: Secondary | ICD-10-CM

## 2022-10-06 DIAGNOSIS — Z3483 Encounter for supervision of other normal pregnancy, third trimester: Secondary | ICD-10-CM

## 2022-10-06 DIAGNOSIS — Z3A31 31 weeks gestation of pregnancy: Secondary | ICD-10-CM

## 2022-10-06 DIAGNOSIS — O163 Unspecified maternal hypertension, third trimester: Secondary | ICD-10-CM

## 2022-10-06 DIAGNOSIS — O0993 Supervision of high risk pregnancy, unspecified, third trimester: Secondary | ICD-10-CM

## 2022-10-06 NOTE — Progress Notes (Signed)
   PRENATAL VISIT NOTE  Subjective:  Sheryl Carroll is a 32 y.o. [id number] at [redacted]w[redacted]d being seen today for ongoing prenatal care.  She is currently monitored for the following issues for this high-risk pregnancy and has Post term pregnancy over 40 weeks; Encounter for supervision of other normal pregnancy, third trimester; Hypertension complicating pregnancy; and BMI 45.0-49.9, adult ([place]) on their problem list.  Patient doing well with no acute concerns today. She reports no complaints.  Contractions: Not present. Vag. Bleeding: None.  Movement: Present. Denies leaking of fluid.   The following portions of the patient's history were reviewed and updated as appropriate: allergies, current medications, past family history, past medical history, past social history, past surgical history and problem list. Problem list updated.  Objective:   Vitals:   10/06/22 1027  BP: 135/79  Pulse: 76  Weight: 272 lb 3.2 oz (123.5 kg)    Fetal Status: Fetal Heart Rate (bpm): 155 Fundal Height: 31 cm Movement: Present     General:  Alert, oriented and cooperative. Patient is in no acute distress.  Skin: Skin is warm and dry. No rash noted.   Cardiovascular: Normal heart rate noted  Respiratory: Normal respiratory effort, no problems with respiration noted  Abdomen: Soft, gravid, appropriate for gestational age.  Pain/Pressure: Absent     Pelvic: Cervical exam deferred        Extremities: Normal range of motion.  Edema: None  Mental Status:  Normal mood and affect. Normal behavior. Normal judgment and thought content.   Assessment and Plan:  Pregnancy: G5P3013 at [redacted]w[redacted]d  1. [redacted] weeks gestation of pregnancy   2. Hypertension affecting pregnancy in third trimester Pt taking labetalol, will start testing this week?  3. Encounter for supervision of other normal pregnancy, third trimester Continue routine prenatal care - RPR - HIV Antibody (routine testing w rflx) - CBC  4. BMI 45.0-49.9,  adult ([place])   Preterm labor symptoms and general obstetric precautions including but not limited to vaginal bleeding, contractions, leaking of fluid and fetal movement were reviewed in detail with the patient.  Please refer to After Visit Summary for other counseling recommendations.   Return in about 2 weeks (around 10/20/2022) for [facility], in person.   Lynnda Shields, MD Faculty Attending Center for [facility]

## 2022-10-07 LAB — CBC
Hematocrit: 38.5 % (ref 34.0–46.6)
Hemoglobin: 12.8 g/dL (ref 11.1–15.9)
MCH: 29.9 pg (ref 26.6–33.0)
MCHC: 33.2 g/dL (ref 31.5–35.7)
MCV: 90 fL (ref 79–97)
Platelets: 256 10*3/uL (ref 150–450)
RBC: 4.28 x10E6/uL (ref 3.77–5.28)
RDW: 12.3 % (ref 11.7–15.4)
WBC: 10.2 10*3/uL (ref 3.4–10.8)

## 2022-10-07 LAB — HIV ANTIBODY (ROUTINE TESTING W REFLEX): HIV Screen 4th Generation wRfx: NONREACTIVE

## 2022-10-07 LAB — RPR: RPR Ser Ql: NONREACTIVE

## 2022-10-10 ENCOUNTER — Ambulatory Visit: Payer: Self-pay

## 2022-10-10 ENCOUNTER — Other Ambulatory Visit: Payer: BLUE CROSS/BLUE SHIELD

## 2022-10-10 ENCOUNTER — Ambulatory Visit: Payer: BC Managed Care – PPO

## 2022-10-15 ENCOUNTER — Other Ambulatory Visit: Payer: Self-pay

## 2022-10-17 ENCOUNTER — Ambulatory Visit: Payer: BC Managed Care – PPO | Attending: Maternal & Fetal Medicine

## 2022-10-17 ENCOUNTER — Ambulatory Visit: Payer: BC Managed Care – PPO | Admitting: *Deleted

## 2022-10-17 VITALS — BP 131/74 | HR 94

## 2022-10-17 DIAGNOSIS — Z3A32 32 weeks gestation of pregnancy: Secondary | ICD-10-CM

## 2022-10-17 DIAGNOSIS — O10913 Unspecified pre-existing hypertension complicating pregnancy, third trimester: Secondary | ICD-10-CM | POA: Insufficient documentation

## 2022-10-17 DIAGNOSIS — O99213 Obesity complicating pregnancy, third trimester: Secondary | ICD-10-CM | POA: Diagnosis present

## 2022-10-17 DIAGNOSIS — O10013 Pre-existing essential hypertension complicating pregnancy, third trimester: Secondary | ICD-10-CM | POA: Diagnosis not present

## 2022-10-17 DIAGNOSIS — E669 Obesity, unspecified: Secondary | ICD-10-CM

## 2022-10-21 ENCOUNTER — Ambulatory Visit (INDEPENDENT_AMBULATORY_CARE_PROVIDER_SITE_OTHER): Payer: BC Managed Care – PPO | Admitting: Obstetrics and Gynecology

## 2022-10-21 ENCOUNTER — Encounter: Payer: Self-pay | Admitting: Obstetrics and Gynecology

## 2022-10-21 ENCOUNTER — Other Ambulatory Visit (HOSPITAL_COMMUNITY)
Admission: RE | Admit: 2022-10-21 | Discharge: 2022-10-21 | Disposition: A | Discharge status: Home / Self Care | Payer: BC Managed Care – PPO | Source: Ambulatory Visit | Attending: Obstetrics and Gynecology | Admitting: Obstetrics and Gynecology

## 2022-10-21 ENCOUNTER — Other Ambulatory Visit: Payer: Self-pay

## 2022-10-21 VITALS — BP 128/82 | HR 81 | Wt 275.0 lb

## 2022-10-21 DIAGNOSIS — Z3483 Encounter for supervision of other normal pregnancy, third trimester: Secondary | ICD-10-CM | POA: Insufficient documentation

## 2022-10-21 DIAGNOSIS — B3731 Acute candidiasis of vulva and vagina: Secondary | ICD-10-CM

## 2022-10-21 DIAGNOSIS — Z789 Other specified health status: Secondary | ICD-10-CM

## 2022-10-21 DIAGNOSIS — O163 Unspecified maternal hypertension, third trimester: Secondary | ICD-10-CM

## 2022-10-21 DIAGNOSIS — B9689 Other specified bacterial agents as the cause of diseases classified elsewhere: Secondary | ICD-10-CM

## 2022-10-21 DIAGNOSIS — Z3A33 33 weeks gestation of pregnancy: Secondary | ICD-10-CM

## 2022-10-21 DIAGNOSIS — N3001 Acute cystitis with hematuria: Secondary | ICD-10-CM

## 2022-10-21 DIAGNOSIS — N760 Acute vaginitis: Secondary | ICD-10-CM

## 2022-10-21 DIAGNOSIS — Z3009 Encounter for other general counseling and advice on contraception: Secondary | ICD-10-CM | POA: Insufficient documentation

## 2022-10-21 NOTE — Progress Notes (Signed)
   PRENATAL VISIT NOTE  Subjective:  Sheryl Carroll is a 32 y.o. [id number] at [redacted]w[redacted]d being seen today for ongoing prenatal care.  She is currently monitored for the following issues for this high-risk pregnancy and has Encounter for supervision of other normal pregnancy, third trimester; Hypertension complicating pregnancy; BMI 45.0-49.9, adult ([place]); Language barrier; and Unwanted fertility on their problem list.  Patient reports pink with wiping when going to the bathroom. No recent intercourse. Denies cramping or ctxns. Contractions: Not present. Vag. Bleeding: Scant.  Movement: Present. Denies leaking of fluid.   The following portions of the patient's history were reviewed and updated as appropriate: allergies, current medications, past family history, past medical history, past social history, past surgical history and problem list.   Objective:   Vitals:   10/21/22 1108  BP: 128/82  Pulse: 81  Weight: 275 lb (124.7 kg)    Fetal Status: Fetal Heart Rate (bpm): 130   Movement: Present     General:  Alert, oriented and cooperative. Patient is in no acute distress.  Skin: Skin is warm and dry. No rash noted.   Cardiovascular: Normal heart rate noted  Respiratory: Normal respiratory effort, no problems with respiration noted  Abdomen: Soft, gravid, appropriate for gestational age.  Pain/Pressure: Absent     Pelvic: Cervical exam deferred        Extremities: Normal range of motion.  Edema: None  Mental Status: Normal mood and affect. Normal behavior. Normal judgment and thought content.   Assessment and Plan:  Pregnancy: B2W4132 at [redacted]w[redacted]d 1. Encounter for supervision of other normal pregnancy, third trimester Check urine culture and swab for cultures due to pink spotting with wiping. Low suspicion for PTL as pt is otherwise asymptomatic.   2. Hypertension affecting pregnancy in third trimester Doing weekly testing Well controlled on Labetalol Serial growth to start on 2/8.  Growth done on 1/12 at time of anatomy and was 64%ile.   3. Language barrier Spanish interpreter throughout   4. Pregnancy with 33 completed weeks gestation  Preterm labor symptoms and general obstetric precautions including but not limited to vaginal bleeding, contractions, leaking of fluid and fetal movement were reviewed in detail with the patient. Please refer to After Visit Summary for other counseling recommendations.   No follow-ups on file.  Future Appointments  Date Time Provider [place]  10/23/2022  1:45 PM [facility] NURSE [facility]  10/23/2022  2:00 PM [facility]-M[facility] US1 [facility]  10/29/2022 11:15 AM [facility]WOCA NST [facility]  11/05/2022 11:15 AM [facility] NST [facility]  11/12/2022 10:15 AM WMC-WOCA NST W[facility]-CWH [place]    Radene Gunning, MD

## 2022-10-22 ENCOUNTER — Telehealth: Payer: Self-pay

## 2022-10-22 ENCOUNTER — Other Ambulatory Visit: Payer: Self-pay

## 2022-10-22 LAB — CERVICOVAGINAL ANCILLARY ONLY
Bacterial Vaginitis (gardnerella): POSITIVE — AB
Candida Glabrata: NEGATIVE
Candida Vaginitis: POSITIVE — AB
Chlamydia: NEGATIVE
Comment: NEGATIVE
Comment: NEGATIVE
Comment: NEGATIVE
Comment: NEGATIVE
Comment: NEGATIVE
Comment: NORMAL
Neisseria Gonorrhea: NEGATIVE
Trichomonas: NEGATIVE

## 2022-10-22 MED ORDER — METRONIDAZOLE 500 MG PO TABS: 500.0000 mg | ORAL_TABLET | Freq: Two times a day (BID) | ORAL | 0 refills | Status: DC

## 2022-10-22 MED ORDER — TERCONAZOLE 0.4 % VA CREA: 1.0000 | TOPICAL_CREAM | Freq: Every day | VAGINAL | 0 refills | Status: DC

## 2022-10-22 NOTE — Addendum Note (Signed)
Addended by: Radene Gunning A on: 10/22/2022 01:54 PM   Modules accepted: Orders

## 2022-10-22 NOTE — Telephone Encounter (Addendum)
-----   Message from Radene Gunning, MD sent at 10/22/2022  1:54 PM EST ----- Please let her know culture is positive for yeast and BV. I have sent treatments to her pharmacy. This could explain her pink with wiping she has had recently.  Thanks, pad  10/22/22 at 1436--called patient using in-house interpreter (Eda). Informed patient of results and treatment instructions, as listed above per provider. Patient verbalized understanding and confirmed that she will be picking up her Rx's today. Patient had no further questions or concerns.

## 2022-10-23 ENCOUNTER — Other Ambulatory Visit: Payer: Self-pay | Admitting: *Deleted

## 2022-10-23 ENCOUNTER — Ambulatory Visit: Payer: BC Managed Care – PPO

## 2022-10-23 ENCOUNTER — Ambulatory Visit: Payer: BC Managed Care – PPO | Attending: Obstetrics and Gynecology

## 2022-10-23 ENCOUNTER — Encounter: Payer: Self-pay | Admitting: *Deleted

## 2022-10-23 VITALS — BP 133/84 | HR 94

## 2022-10-23 DIAGNOSIS — O0932 Supervision of pregnancy with insufficient antenatal care, second trimester: Secondary | ICD-10-CM | POA: Diagnosis present

## 2022-10-23 DIAGNOSIS — Z3A33 33 weeks gestation of pregnancy: Secondary | ICD-10-CM

## 2022-10-23 DIAGNOSIS — O99213 Obesity complicating pregnancy, third trimester: Secondary | ICD-10-CM

## 2022-10-23 DIAGNOSIS — E669 Obesity, unspecified: Secondary | ICD-10-CM | POA: Diagnosis not present

## 2022-10-23 DIAGNOSIS — O0933 Supervision of pregnancy with insufficient antenatal care, third trimester: Secondary | ICD-10-CM | POA: Diagnosis not present

## 2022-10-23 DIAGNOSIS — Z3483 Encounter for supervision of other normal pregnancy, third trimester: Secondary | ICD-10-CM

## 2022-10-23 DIAGNOSIS — O99212 Obesity complicating pregnancy, second trimester: Secondary | ICD-10-CM | POA: Insufficient documentation

## 2022-10-23 DIAGNOSIS — O10912 Unspecified pre-existing hypertension complicating pregnancy, second trimester: Secondary | ICD-10-CM | POA: Insufficient documentation

## 2022-10-23 DIAGNOSIS — O10913 Unspecified pre-existing hypertension complicating pregnancy, third trimester: Secondary | ICD-10-CM | POA: Insufficient documentation

## 2022-10-23 DIAGNOSIS — O10013 Pre-existing essential hypertension complicating pregnancy, third trimester: Secondary | ICD-10-CM | POA: Diagnosis not present

## 2022-10-23 DIAGNOSIS — O10919 Unspecified pre-existing hypertension complicating pregnancy, unspecified trimester: Secondary | ICD-10-CM

## 2022-10-25 LAB — CULTURE, OB URINE

## 2022-10-25 LAB — URINE CULTURE, OB REFLEX

## 2022-10-26 DIAGNOSIS — N3001 Acute cystitis with hematuria: Secondary | ICD-10-CM | POA: Insufficient documentation

## 2022-10-26 DIAGNOSIS — O2343 Unspecified infection of urinary tract in pregnancy, third trimester: Secondary | ICD-10-CM | POA: Insufficient documentation

## 2022-10-26 MED ORDER — CEFADROXIL 500 MG PO CAPS: 500.0000 mg | ORAL_CAPSULE | Freq: Two times a day (BID) | ORAL | 0 refills | Status: DC

## 2022-10-26 NOTE — Addendum Note (Signed)
Addended by: Radene Gunning A on: 10/26/2022 09:16 AM   Modules accepted: Orders

## 2022-10-28 ENCOUNTER — Telehealth: Payer: Self-pay

## 2022-10-28 NOTE — Telephone Encounter (Addendum)
-----   Message from Radene Gunning, MD sent at 10/26/2022  9:16 AM EST ----- Has UTI - please inform and let her know I sent in ABX.  Thanks, pad  Called patient at number listed in [place] [phone]. Was sent to unidentified voicemailbox; left message for patient to call office back so we could discuss test results.   Adonis Huguenin RN, 10/28/22 at [phone]

## 2022-10-29 ENCOUNTER — Other Ambulatory Visit: Payer: Self-pay

## 2022-10-29 NOTE — Telephone Encounter (Signed)
Called pt with interpreter Sheryl Carroll and informed her of UTI. A prescription for antibiotic has been sent to her pharmacy. Dose instructions given.  Pt voiced understanding.

## 2022-10-31 ENCOUNTER — Ambulatory Visit: Payer: BC Managed Care – PPO

## 2022-11-05 ENCOUNTER — Ambulatory Visit (INDEPENDENT_AMBULATORY_CARE_PROVIDER_SITE_OTHER): Payer: BC Managed Care – PPO | Admitting: Obstetrics and Gynecology

## 2022-11-05 ENCOUNTER — Ambulatory Visit (INDEPENDENT_AMBULATORY_CARE_PROVIDER_SITE_OTHER): Payer: BC Managed Care – PPO

## 2022-11-05 ENCOUNTER — Ambulatory Visit: Payer: BC Managed Care – PPO | Admitting: *Deleted

## 2022-11-05 ENCOUNTER — Other Ambulatory Visit: Payer: Self-pay

## 2022-11-05 VITALS — BP 137/84 | HR 106 | Wt 273.8 lb

## 2022-11-05 DIAGNOSIS — Z6841 Body Mass Index (BMI) 40.0 and over, adult: Secondary | ICD-10-CM

## 2022-11-05 DIAGNOSIS — Z3009 Encounter for other general counseling and advice on contraception: Secondary | ICD-10-CM

## 2022-11-05 DIAGNOSIS — O163 Unspecified maternal hypertension, third trimester: Secondary | ICD-10-CM

## 2022-11-05 DIAGNOSIS — Z789 Other specified health status: Secondary | ICD-10-CM

## 2022-11-05 DIAGNOSIS — O10913 Unspecified pre-existing hypertension complicating pregnancy, third trimester: Secondary | ICD-10-CM

## 2022-11-05 DIAGNOSIS — Z3483 Encounter for supervision of other normal pregnancy, third trimester: Secondary | ICD-10-CM

## 2022-11-05 DIAGNOSIS — O99213 Obesity complicating pregnancy, third trimester: Secondary | ICD-10-CM | POA: Diagnosis not present

## 2022-11-05 DIAGNOSIS — Z3A35 35 weeks gestation of pregnancy: Secondary | ICD-10-CM | POA: Diagnosis not present

## 2022-11-05 DIAGNOSIS — O99210 Obesity complicating pregnancy, unspecified trimester: Secondary | ICD-10-CM

## 2022-11-05 NOTE — Addendum Note (Signed)
Addended by: Aletha Halim on: 11/05/2022 11:39 AM   Modules accepted: Orders

## 2022-11-05 NOTE — Progress Notes (Addendum)
   PRENATAL VISIT NOTE  Subjective:  Sheryl Carroll is a 32 y.o. [id number] at 30w2dbeing seen today for ongoing prenatal care.  She is currently monitored for the following issues for this high-risk pregnancy and has Encounter for supervision of other normal pregnancy, third trimester; Hypertension complicating pregnancy; BMI 50.0-59.9, adult ([place]; Language barrier; Unwanted fertility; Acute cystitis with hematuria; and Obesity in pregnancy on their problem list.  Patient reports no complaints.  Contractions: Not present. Vag. Bleeding: None.  Movement: Present. Denies leaking of fluid.   The following portions of the patient's history were reviewed and updated as appropriate: allergies, current medications, past family history, past medical history, past social history, past surgical history and problem list.   Objective:   Vitals:   11/05/22 1118  BP: 137/84  Pulse: (!) 106  Weight: 273 lb 12.8 oz (124.2 kg)    Fetal Status: Fetal Heart Rate (bpm): 145   Movement: Present     General:  Alert, oriented and cooperative. Patient is in no acute distress.  Skin: Skin is warm and dry. No rash noted.   Cardiovascular: Normal heart rate noted  Respiratory: Normal respiratory effort, no problems with respiration noted  Abdomen: Soft, gravid, appropriate for gestational age.  Pain/Pressure: Present     Pelvic: Cervical exam deferred        Extremities: Normal range of motion.  Edema: None  Mental Status: Normal mood and affect. Normal behavior. Normal judgment and thought content.   Assessment and Plan:  Pregnancy: [id number] [redacted]w[redacted]d. BMI 50.0-59.9, adult ([place]  2. Chronic hypertension complicating or reason for care during pregnancy, third trimester Doing well on labetalol 50 qday (confirmed dosing with pt) Set up 39wk IOL nv Qwk testing for today 2/8: 2201g, 43%, ac 76%, afi 11.3, 8/8  3. Language barrier Ipad interpreter used  4. Unwanted fertility Patient  has coPharmacist, communityI d/w her that we will assess her belly after delivery and if it's safe we can do btl; possibility of having to do interval also d/w her  5. Encounter for supervision of other normal pregnancy, third trimester  6. Obesity in pregnancy  Preterm labor symptoms and general obstetric precautions including but not limited to vaginal bleeding, contractions, leaking of fluid and fetal movement were reviewed in detail with the patient. Please refer to After Visit Summary for other counseling recommendations.   No follow-ups on file.  Future Appointments  Date Time Provider DeHolliday2/21/2024 12:15 PM [facility] US1 [facility]2/28/2024  8:55 AM PiAletha HalimMD [facility]2/28/2024 10:15 AM [facility] NST [facility]3/04/2023  1:45 PM W[facility]MFC NURSE [facility]3/04/2023  2:00 PM W[facility]-MFC US1 W[facility]-MFCUS W[facility]    ChAletha HalimMD

## 2022-11-07 ENCOUNTER — Ambulatory Visit: Payer: BC Managed Care – PPO

## 2022-11-07 ENCOUNTER — Other Ambulatory Visit: Payer: BC Managed Care – PPO

## 2022-11-12 ENCOUNTER — Ambulatory Visit: Payer: BC Managed Care – PPO | Admitting: General Practice

## 2022-11-12 ENCOUNTER — Encounter: Payer: Self-pay | Admitting: Obstetrics and Gynecology

## 2022-11-12 ENCOUNTER — Other Ambulatory Visit (HOSPITAL_COMMUNITY)
Admission: RE | Admit: 2022-11-12 | Discharge: 2022-11-12 | Disposition: A | Discharge status: Home / Self Care | Payer: BC Managed Care – PPO | Source: Ambulatory Visit | Attending: Obstetrics and Gynecology | Admitting: Obstetrics and Gynecology

## 2022-11-12 ENCOUNTER — Other Ambulatory Visit: Payer: Self-pay

## 2022-11-12 ENCOUNTER — Ambulatory Visit (INDEPENDENT_AMBULATORY_CARE_PROVIDER_SITE_OTHER): Payer: BC Managed Care – PPO

## 2022-11-12 ENCOUNTER — Ambulatory Visit (INDEPENDENT_AMBULATORY_CARE_PROVIDER_SITE_OTHER): Payer: BC Managed Care – PPO | Admitting: Obstetrics and Gynecology

## 2022-11-12 VITALS — BP 128/78 | Wt 276.1 lb

## 2022-11-12 DIAGNOSIS — O10919 Unspecified pre-existing hypertension complicating pregnancy, unspecified trimester: Secondary | ICD-10-CM

## 2022-11-12 DIAGNOSIS — Z3483 Encounter for supervision of other normal pregnancy, third trimester: Secondary | ICD-10-CM

## 2022-11-12 DIAGNOSIS — O10913 Unspecified pre-existing hypertension complicating pregnancy, third trimester: Secondary | ICD-10-CM | POA: Diagnosis not present

## 2022-11-12 DIAGNOSIS — Z3A36 36 weeks gestation of pregnancy: Secondary | ICD-10-CM

## 2022-11-12 NOTE — Progress Notes (Signed)
Pt informed that the ultrasound is considered a limited OB ultrasound and is not intended to be a complete ultrasound exam.  Patient also informed that the ultrasound is not being completed with the intent of assessing for fetal or placental anomalies or any pelvic abnormalities.  Explained that the purpose of today's ultrasound is to assess for  BPP, presentation, and AFI.  Patient acknowledges the purpose of the exam and the limitations of the study.     Koren Bound RN BSN 11/12/22

## 2022-11-12 NOTE — Progress Notes (Signed)
   PRENATAL VISIT NOTE  Subjective:  Sheryl Carroll is a 32 y.o. [id number] at 52w2dbeing seen today for ongoing prenatal care.  She is currently monitored for the following issues for this high-risk pregnancy and has Encounter for supervision of other normal pregnancy, third trimester; Chronic hypertension; BMI 50.0-59.9, adult ([place]; Language barrier; Unwanted fertility; UTI in pregnancy, antepartum, third trimester; and Obesity in pregnancy on their problem list.  Patient reports no complaints.  Contractions: Not present. Vag. Bleeding: None.  Movement: Present. Denies leaking of fluid.   The following portions of the patient's history were reviewed and updated as appropriate: allergies, current medications, past family history, past medical history, past social history, past surgical history and problem list.   Objective:   Vitals:   11/12/22 0902  BP: 128/78  Weight: 276 lb 1.6 oz (125.2 kg)    Fetal Status: Fetal Heart Rate (bpm): 151   Movement: Present  Presentation: Vertex  General:  Alert, oriented and cooperative. Patient is in no acute distress.  Skin: Skin is warm and dry. No rash noted.   Cardiovascular: Normal heart rate noted  Respiratory: Normal respiratory effort, no problems with respiration noted  Abdomen: Soft, gravid, appropriate for gestational age.  Pain/Pressure: Present     Pelvic: Cervical exam deferred Dilation: 1 Effacement (%): Thick    Extremities: Normal range of motion.  Edema: None  Mental Status: Normal mood and affect. Normal behavior. Normal judgment and thought content.   Assessment and Plan:  Pregnancy: [id number] 332w2d. BMI 50.0-59.9, adult (HCPymatuning NorthSee below   2. Chronic hypertension complicating or reason for care during pregnancy, third trimester Doing well on labetalol 50 qday (confirmed dosing with pt) Set up 39wk IOL nv Qwk testing for today 2/21: ceph, afi 8.8, 10/10 2/8: 43%, 2201g, ac 76%, afi 11.3, ceph  3. Language  barrier In person interpreter used   4. Unwanted fertility Previously d/w pt: Patient has coPharmacist, communityI d/w her that we will assess her belly after delivery and if it's safe we can do btl; possibility of having to do interval also d/w her   5. Encounter for supervision of other normal pregnancy, third trimester   6. Obesity in pregnancy  Preterm labor symptoms and general obstetric precautions including but not limited to vaginal bleeding, contractions, leaking of fluid and fetal movement were reviewed in detail with the patient. Please refer to After Visit Summary for other counseling recommendations.   Return in about 9 days (around 11/21/2022) for high risk ob, md or app, in person, nst/bpp with diane or carrie.  Future Appointments  Date Time Provider DeKilbourne2/28/2024 10:15 AM [facility]3/04/2023  1:45 PM W[facility]MFC NURSE [facility]/04/2023  2:00 PM W[facility]-MFC US1 W[facility]-MFCUS W[facility]    ChAletha HalimMD

## 2022-11-13 ENCOUNTER — Ambulatory Visit: Payer: BC Managed Care – PPO

## 2022-11-13 LAB — GC/CHLAMYDIA PROBE AMP (~~LOC~~) NOT AT ARMC
Chlamydia: NEGATIVE
Comment: NEGATIVE
Comment: NORMAL
Neisseria Gonorrhea: NEGATIVE

## 2022-11-14 ENCOUNTER — Ambulatory Visit: Payer: BC Managed Care – PPO

## 2022-11-14 ENCOUNTER — Other Ambulatory Visit: Payer: BC Managed Care – PPO

## 2022-11-15 LAB — CULTURE, BETA STREP (GROUP B ONLY): Strep Gp B Culture: POSITIVE — AB

## 2022-11-16 ENCOUNTER — Encounter: Payer: Self-pay | Admitting: Obstetrics and Gynecology

## 2022-11-16 DIAGNOSIS — O99820 Streptococcus B carrier state complicating pregnancy: Secondary | ICD-10-CM | POA: Insufficient documentation

## 2022-11-20 ENCOUNTER — Ambulatory Visit (INDEPENDENT_AMBULATORY_CARE_PROVIDER_SITE_OTHER): Payer: BC Managed Care – PPO | Admitting: Family Medicine

## 2022-11-20 ENCOUNTER — Other Ambulatory Visit: Payer: Self-pay

## 2022-11-20 VITALS — BP 135/87 | HR 121 | Wt 276.0 lb

## 2022-11-20 DIAGNOSIS — O10919 Unspecified pre-existing hypertension complicating pregnancy, unspecified trimester: Secondary | ICD-10-CM

## 2022-11-20 DIAGNOSIS — I10 Essential (primary) hypertension: Secondary | ICD-10-CM

## 2022-11-20 DIAGNOSIS — Z3483 Encounter for supervision of other normal pregnancy, third trimester: Secondary | ICD-10-CM

## 2022-11-20 DIAGNOSIS — Z3A38 38 weeks gestation of pregnancy: Secondary | ICD-10-CM

## 2022-11-20 DIAGNOSIS — Z789 Other specified health status: Secondary | ICD-10-CM

## 2022-11-20 DIAGNOSIS — O10913 Unspecified pre-existing hypertension complicating pregnancy, third trimester: Secondary | ICD-10-CM

## 2022-11-20 DIAGNOSIS — O99820 Streptococcus B carrier state complicating pregnancy: Secondary | ICD-10-CM

## 2022-11-21 ENCOUNTER — Encounter (HOSPITAL_COMMUNITY): Payer: Self-pay

## 2022-11-21 ENCOUNTER — Ambulatory Visit: Payer: BC Managed Care – PPO | Attending: Obstetrics | Admitting: *Deleted

## 2022-11-21 ENCOUNTER — Ambulatory Visit (HOSPITAL_BASED_OUTPATIENT_CLINIC_OR_DEPARTMENT_OTHER): Payer: BC Managed Care – PPO

## 2022-11-21 ENCOUNTER — Telehealth (HOSPITAL_COMMUNITY): Payer: Self-pay | Admitting: *Deleted

## 2022-11-21 ENCOUNTER — Encounter: Payer: Self-pay | Admitting: Family Medicine

## 2022-11-21 VITALS — BP 124/71 | HR 94

## 2022-11-21 DIAGNOSIS — E669 Obesity, unspecified: Secondary | ICD-10-CM | POA: Diagnosis not present

## 2022-11-21 DIAGNOSIS — Z3A37 37 weeks gestation of pregnancy: Secondary | ICD-10-CM | POA: Insufficient documentation

## 2022-11-21 DIAGNOSIS — O99213 Obesity complicating pregnancy, third trimester: Secondary | ICD-10-CM | POA: Diagnosis not present

## 2022-11-21 DIAGNOSIS — O10913 Unspecified pre-existing hypertension complicating pregnancy, third trimester: Secondary | ICD-10-CM | POA: Insufficient documentation

## 2022-11-21 DIAGNOSIS — O10013 Pre-existing essential hypertension complicating pregnancy, third trimester: Secondary | ICD-10-CM | POA: Diagnosis not present

## 2022-11-21 DIAGNOSIS — O10919 Unspecified pre-existing hypertension complicating pregnancy, unspecified trimester: Secondary | ICD-10-CM

## 2022-11-21 NOTE — Telephone Encounter (Signed)
Preadmission screen [phone]

## 2022-11-24 NOTE — Progress Notes (Addendum)
   PRENATAL VISIT NOTE  Subjective:  Sheryl Carroll is a 32 y.o. [id number] at [redacted]w[redacted]d being seen today for ongoing prenatal care.  She is currently monitored for the following issues for this high-risk pregnancy and has Encounter for supervision of other normal pregnancy, third trimester; Chronic hypertension; BMI 50.0-59.9, adult ([place]); Language barrier; Unwanted fertility; UTI in pregnancy, antepartum, third trimester; Obesity in pregnancy; and GBS (group B Streptococcus carrier), +RV culture, currently pregnant on their problem list.  Patient reports no complaints.  Contractions: Irritability. Vag. Bleeding: None.  Movement: Present. Denies leaking of fluid.   The following portions of the patient's history were reviewed and updated as appropriate: allergies, current medications, past family history, past medical history, past social history, past surgical history and problem list.   Objective:   Vitals:   11/20/22 1038  BP: 135/87  Pulse: (!) 121  Weight: 276 lb (125.2 kg)    Fetal Status: Fetal Heart Rate (bpm): NST   Movement: Present     General:  Alert, oriented and cooperative. Patient is in no acute distress.  Skin: Skin is warm and dry. No rash noted.   Cardiovascular: Normal heart rate noted  Respiratory: Normal respiratory effort, no problems with respiration noted  Abdomen: Soft, gravid, appropriate for gestational age.  Pain/Pressure: Present     Pelvic: Cervical exam deferred        Extremities: Normal range of motion.  Edema: None  Mental Status: Normal mood and affect. Normal behavior. Normal judgment and thought content.   Assessment and Plan:  Pregnancy: G5P3013 at [redacted]w[redacted]d 1. Chronic hypertension affecting pregnancy BP is ok on labetalol ON ASA Last u/s for growth on 3/8 is 64%, vtx, normal fluid - Fetal nonstress test; Future NST:  Baseline: 140 bpm, Variability: Good {> 6 bpm), Accelerations: Reactive, and Decelerations: Absent  3. Language  barrier Spanish interpreter: Eda used  4. GBS (group B Streptococcus carrier), +RV culture, currently pregnant Will need treatment in labor  5. Encounter for supervision of other normal pregnancy, third trimester   Term labor symptoms and general obstetric precautions including but not limited to vaginal bleeding, contractions, leaking of fluid and fetal movement were reviewed in detail with the patient. Please refer to After Visit Summary for other counseling recommendations.   Return in 1 week (on 11/27/2022) for [facility], OB visit and BPP.  Future Appointments  Date Time Provider [place]  11/27/2022 10:15 AM [facility] NST [facility]  11/28/2022  6:45 AM MC-LD SCHED ROOM MC-INDC None    Donnamae Jude, MD

## 2022-11-25 ENCOUNTER — Encounter (HOSPITAL_COMMUNITY): Payer: Self-pay | Admitting: *Deleted

## 2022-11-25 ENCOUNTER — Telehealth (HOSPITAL_COMMUNITY): Payer: Self-pay | Admitting: *Deleted

## 2022-11-25 NOTE — Telephone Encounter (Signed)
Preadmission screen interpreter number [phone]

## 2022-11-26 ENCOUNTER — Other Ambulatory Visit: Payer: Self-pay | Admitting: Advanced Practice Midwife

## 2022-11-26 ENCOUNTER — Encounter (HOSPITAL_COMMUNITY): Payer: BC Managed Care – PPO

## 2022-11-27 ENCOUNTER — Ambulatory Visit: Payer: BC Managed Care – PPO | Admitting: Advanced Practice Midwife

## 2022-11-27 ENCOUNTER — Other Ambulatory Visit: Payer: Self-pay

## 2022-11-27 ENCOUNTER — Other Ambulatory Visit (INDEPENDENT_AMBULATORY_CARE_PROVIDER_SITE_OTHER): Payer: BC Managed Care – PPO

## 2022-11-27 VITALS — BP 123/77 | HR 93 | Wt 277.4 lb

## 2022-11-27 DIAGNOSIS — O10919 Unspecified pre-existing hypertension complicating pregnancy, unspecified trimester: Secondary | ICD-10-CM

## 2022-11-27 DIAGNOSIS — O10913 Unspecified pre-existing hypertension complicating pregnancy, third trimester: Secondary | ICD-10-CM | POA: Diagnosis not present

## 2022-11-27 DIAGNOSIS — Z3A38 38 weeks gestation of pregnancy: Secondary | ICD-10-CM | POA: Diagnosis not present

## 2022-11-27 DIAGNOSIS — Z3483 Encounter for supervision of other normal pregnancy, third trimester: Secondary | ICD-10-CM

## 2022-11-27 NOTE — Progress Notes (Signed)
   PRENATAL VISIT NOTE  Subjective:  Sheryl Carroll is a 32 y.o. [id number] at [redacted]w[redacted]d being seen today for ongoing prenatal care.  She is currently monitored for the following issues for this high-risk pregnancy and has Encounter for supervision of other normal pregnancy, third trimester; Chronic hypertension; BMI 50.0-59.9, adult ([place]); Language barrier; Unwanted fertility; UTI in pregnancy, antepartum, third trimester; Obesity in pregnancy; and GBS (group B Streptococcus carrier), +RV culture, currently pregnant on their problem list.  Patient reports no complaints.  Contractions: Not present. Vag. Bleeding: None.  Movement: Present. Denies leaking of fluid.   The following portions of the patient's history were reviewed and updated as appropriate: allergies, current medications, past family history, past medical history, past social history, past surgical history and problem list.   Objective:   Vitals:   11/27/22 1048  BP: 123/77  Pulse: 93  Weight: 277 lb 6.4 oz (125.8 kg)    Fetal Status: Fetal Heart Rate (bpm): nst   Movement: Present  Presentation: Vertex  General:  Alert, oriented and cooperative. Patient is in no acute distress.  Skin: Skin is warm and dry. No rash noted.   Cardiovascular: Normal heart rate noted  Respiratory: Normal respiratory effort, no problems with respiration noted  Abdomen: Soft, gravid, appropriate for gestational age.  Pain/Pressure: Present     Pelvic: Cervical exam deferred        Extremities: Normal range of motion.  Edema: Trace  Mental Status: Normal mood and affect. Normal behavior. Normal judgment and thought content.   Assessment and Plan:  Pregnancy: V6H2094 at [redacted]w[redacted]d 1. Encounter for supervision of other normal pregnancy, third trimester --Anticipatory guidance about next visits/weeks of pregnancy given.  --IOL scheduled for tomorrow   2. Chronic hypertension affecting pregnancy --Well controlled on labetalol - US FETAL BPP  W/NONSTRESS; Future  Preterm labor symptoms and general obstetric precautions including but not limited to vaginal bleeding, contractions, leaking of fluid and fetal movement were reviewed in detail with the patient. Please refer to After Visit Summary for other counseling recommendations.   Return for IOL scheduled 11/28/22.  Future Appointments  Date Time Provider [place]  11/28/2022  6:45 AM MC-LD [place] None    [name], CNM

## 2022-11-27 NOTE — Progress Notes (Signed)
Pt informed that the ultrasound is considered a limited OB ultrasound and is not intended to be a complete ultrasound exam.  Patient also informed that the ultrasound is not being completed with the intent of assessing for fetal or placental anomalies or any pelvic abnormalities.  Explained that the purpose of today's ultrasound is to assess for  BPP, presentation, and AFI.  Patient acknowledges the purpose of the exam and the limitations of the study.     Koren Bound RN BSN 11/27/22

## 2022-11-28 ENCOUNTER — Other Ambulatory Visit: Payer: Self-pay

## 2022-11-28 ENCOUNTER — Inpatient Hospital Stay (HOSPITAL_COMMUNITY)
Admission: RE | Admit: 2022-11-28 | Discharge: 2022-11-30 | DRG: 807 | Disposition: A | Discharge status: Home / Self Care | Payer: BC Managed Care – PPO | Attending: Obstetrics and Gynecology | Admitting: Obstetrics and Gynecology

## 2022-11-28 ENCOUNTER — Inpatient Hospital Stay (HOSPITAL_COMMUNITY): Payer: BC Managed Care – PPO

## 2022-11-28 DIAGNOSIS — O26893 Other specified pregnancy related conditions, third trimester: Secondary | ICD-10-CM | POA: Diagnosis present

## 2022-11-28 DIAGNOSIS — O99214 Obesity complicating childbirth: Secondary | ICD-10-CM | POA: Diagnosis present

## 2022-11-28 DIAGNOSIS — O99824 Streptococcus B carrier state complicating childbirth: Secondary | ICD-10-CM | POA: Diagnosis present

## 2022-11-28 DIAGNOSIS — O1002 Pre-existing essential hypertension complicating childbirth: Secondary | ICD-10-CM | POA: Diagnosis present

## 2022-11-28 DIAGNOSIS — Z3A37 37 weeks gestation of pregnancy: Secondary | ICD-10-CM

## 2022-11-28 DIAGNOSIS — I10 Essential (primary) hypertension: Secondary | ICD-10-CM | POA: Diagnosis present

## 2022-11-28 DIAGNOSIS — Z7982 Long term (current) use of aspirin: Secondary | ICD-10-CM

## 2022-11-28 DIAGNOSIS — Z3483 Encounter for supervision of other normal pregnancy, third trimester: Principal | ICD-10-CM

## 2022-11-28 DIAGNOSIS — O9081 Anemia of the puerperium: Principal | ICD-10-CM

## 2022-11-28 DIAGNOSIS — O99820 Streptococcus B carrier state complicating pregnancy: Secondary | ICD-10-CM | POA: Diagnosis not present

## 2022-11-28 DIAGNOSIS — Z3A38 38 weeks gestation of pregnancy: Secondary | ICD-10-CM | POA: Diagnosis not present

## 2022-11-28 DIAGNOSIS — O10919 Unspecified pre-existing hypertension complicating pregnancy, unspecified trimester: Secondary | ICD-10-CM | POA: Diagnosis present

## 2022-11-28 DIAGNOSIS — Z6841 Body Mass Index (BMI) 40.0 and over, adult: Secondary | ICD-10-CM

## 2022-11-28 LAB — CBC
HCT: 39.4 % (ref 36.0–46.0)
Hemoglobin: 13.6 g/dL (ref 12.0–15.0)
MCH: 30.6 pg (ref 26.0–34.0)
MCHC: 34.5 g/dL (ref 30.0–36.0)
MCV: 88.5 fL (ref 80.0–100.0)
Platelets: 221 10*3/uL (ref 150–400)
RBC: 4.45 MIL/uL (ref 3.87–5.11)
RDW: 13.2 % (ref 11.5–15.5)
WBC: 7.3 10*3/uL (ref 4.0–10.5)
nRBC: 0 % (ref 0.0–0.2)

## 2022-11-28 LAB — TYPE AND SCREEN
ABO/RH(D): O POS
Antibody Screen: NEGATIVE

## 2022-11-28 MED ORDER — PENICILLIN G POT IN DEXTROSE 60000 UNIT/ML IV SOLN
3.0000 10*6.[IU] | INTRAVENOUS | Status: DC
  Administered 2022-11-29: 3 10*6.[IU] via INTRAVENOUS
  Filled 2022-11-28 (×2): qty 50

## 2022-11-28 MED ORDER — OXYTOCIN BOLUS FROM INFUSION
333.0000 mL | Freq: Once | INTRAVENOUS | Status: AC
  Administered 2022-11-29: 333 mL via INTRAVENOUS

## 2022-11-28 MED ORDER — LACTATED RINGERS IV SOLN: 500.0000 mL | INTRAVENOUS | Status: DC | PRN

## 2022-11-28 MED ORDER — TERBUTALINE SULFATE 1 MG/ML IJ SOLN
0.2500 mg | Freq: Once | INTRAMUSCULAR | Status: DC | PRN
  Filled 2022-11-28: qty 1

## 2022-11-28 MED ORDER — ONDANSETRON HCL 4 MG/2ML IJ SOLN: 4.0000 mg | Freq: Four times a day (QID) | INTRAMUSCULAR | Status: DC | PRN

## 2022-11-28 MED ORDER — FLEET ENEMA 7-19 GM/118ML RE ENEM: 1.0000 | ENEMA | RECTAL | Status: DC | PRN

## 2022-11-28 MED ORDER — MISOPROSTOL 50MCG HALF TABLET
50.0000 ug | ORAL_TABLET | Freq: Once | ORAL | Status: AC
  Administered 2022-11-28: 50 ug via ORAL
  Filled 2022-11-28: qty 1

## 2022-11-28 MED ORDER — LIDOCAINE HCL (PF) 1 % IJ SOLN: 30.0000 mL | INTRAMUSCULAR | Status: DC | PRN

## 2022-11-28 MED ORDER — MISOPROSTOL 25 MCG QUARTER TABLET
25.0000 ug | ORAL_TABLET | Freq: Once | ORAL | Status: AC
  Administered 2022-11-28: 25 ug via VAGINAL
  Filled 2022-11-28: qty 1

## 2022-11-28 MED ORDER — SODIUM CHLORIDE 0.9 % IV SOLN
5.0000 10*6.[IU] | Freq: Once | INTRAVENOUS | Status: AC
  Administered 2022-11-28: 5 10*6.[IU] via INTRAVENOUS
  Filled 2022-11-28 (×2): qty 5

## 2022-11-28 MED ORDER — ACETAMINOPHEN 325 MG PO TABS: 650.0000 mg | ORAL_TABLET | ORAL | Status: DC | PRN

## 2022-11-28 MED ORDER — OXYCODONE-ACETAMINOPHEN 5-325 MG PO TABS: 1.0000 | ORAL_TABLET | ORAL | Status: DC | PRN

## 2022-11-28 MED ORDER — SOD CITRATE-CITRIC ACID 500-334 MG/5ML PO SOLN: 30.0000 mL | ORAL | Status: DC | PRN

## 2022-11-28 MED ORDER — OXYTOCIN-SODIUM CHLORIDE 30-0.9 UT/500ML-% IV SOLN
2.5000 [IU]/h | INTRAVENOUS | Status: DC
  Administered 2022-11-29: 2.5 [IU]/h via INTRAVENOUS
  Filled 2022-11-28: qty 500

## 2022-11-28 MED ORDER — OXYCODONE-ACETAMINOPHEN 5-325 MG PO TABS: 2.0000 | ORAL_TABLET | ORAL | Status: DC | PRN

## 2022-11-28 MED ORDER — LACTATED RINGERS IV SOLN: INTRAVENOUS | Status: DC

## 2022-11-28 NOTE — Progress Notes (Signed)
Sheryl Carroll is a 32 y.o. [id number] at 106w4d by LMP admitted for induction of labor due to [facility] on labetalol.  Subjective: Pt comfortable, feeling mild contractions, feeling normal fetal movement.  Objective: LMP 03/03/2022 (Exact Date)  No intake/output data recorded. No intake/output data recorded.  FHT:  FHR: 145 bpm, variability: moderate,  accelerations:  Present,  decelerations:  Absent UC:   irregular, every 7-15 minutes SVE:   Dilation: 1 Exam by:: Dr. Caron Presume  Labs: Lab Results  Component Value Date   WBC 7.3 11/28/2022   HGB 13.6 11/28/2022   HCT 39.4 11/28/2022   MCV 88.5 11/28/2022   PLT 221 11/28/2022    Assessment / Plan: [id number]  at [redacted]w[redacted]d    Labor: Progressing normally. Discussed labor plan with patient who desires unmed Preeclampsia:  labs stable Fetal Wellbeing:  Category I Pain Control:  Labor support without medications I/D:   GBS positive on PCN Anticipated MOD:  NSVD  [name], CNM 11/28/2022, 8:43 PM

## 2022-11-28 NOTE — Progress Notes (Signed)
Spoke with Dr. Gala Romney about the fact that we will not be able to get pt in for IOL today. Per Dr. Gala Romney pt OK to wait at home until we are able to call her.

## 2022-11-28 NOTE — H&P (Signed)
OBSTETRIC ADMISSION HISTORY AND PHYSICAL  Sheryl Carroll is a 32 y.o. female [phone] with IUP at [redacted]w[redacted]d by LMP presenting for IOL . She reports +FMs, No LOF, no VB, no blurry vision, headaches or peripheral edema, and RUQ pain.  She plans on breast and formula feeding. She request BTL for birth control. She received her prenatal care at  [place]: By LMP --->  Estimated Date of Delivery: 12/08/22  Sono:    @[redacted]w[redacted]d , CWD, normal anatomy, cephalic presentation, posterior lie, 3292 g, 64% EFW   Prenatal History/Complications: cHTN  Past Medical History: Past Medical History:  Diagnosis Date   Hypertension    Medical history non-contributory     Past Surgical History: Past Surgical History:  Procedure Laterality Date   NO PAST SURGERIES      Obstetrical History: OB History     Gravida  5   Para  3   Term  3   Preterm  0   AB  1   Living  3      SAB  1   IAB  0   Ectopic  0   Multiple  0   Live Births  3           Social History Social History   Socioeconomic History   Marital status: Single    Spouse name: Not on file   Number of children: Not on file   Years of education: Not on file   Highest education level: Not on file  Occupational History   Not on file  Tobacco Use   Smoking status: Never   Smokeless tobacco: Never  Vaping Use   Vaping Use: Never used  Substance and Sexual Activity   Alcohol use: No   Drug use: Never   Sexual activity: Yes    Birth control/protection: None    Comment: planning BTL  Other Topics Concern   Not on file  Social History Narrative   ** Merged History Encounter **       Social Determinants of Health   Financial Resource Strain: Low Risk  (05/10/2018)   Overall Financial Resource Strain (CARDIA)    Difficulty of Paying Living Expenses: Not very hard  Food Insecurity: No Food Insecurity (09/16/2022)   Hunger Vital Sign    Worried About Running Out of Food in the Last Year: Never true    Ran  Out of Food in the Last Year: Never true  Transportation Needs: No Transportation Needs (09/16/2022)   PRAPARE - Hydrologist (Medical): No    Lack of Transportation (Non-Medical): No  Physical Activity: Not on file  Stress: Not on file  Social Connections: Not on file    Family History: Family History  Problem Relation Age of Onset   Hypertension Mother    Hypertension Father    Diabetes Paternal Grandmother    Asthma Neg Hx    Cancer Neg Hx    Heart disease Neg Hx    Stroke Neg Hx     Allergies: No Known Allergies  Medications Prior to Admission  Medication Sig Dispense Refill Last Dose   aspirin EC 81 MG tablet Take 81 mg by mouth daily. Swallow whole.      labetalol (NORMODYNE) 100 MG tablet Take 50 mg by mouth daily.      Prenatal Vit-Fe Fumarate-FA (PRENATAL MULTIVITAMIN) TABS tablet Take 1 tablet by mouth daily at 12 noon.        Review  of Systems   All systems reviewed and negative except as stated in HPI  Last menstrual period 03/03/2022, unknown if currently breastfeeding. General appearance: alert, cooperative, and appears stated age Lungs: Normal work of breathing  Heart: regular rate and rhythm Abdomen: soft, non-tender; bowel sounds normal Pelvic: 1/thick/high Extremities: Homans sign is negative, no sign of DVT Presentation: cephalic Fetal monitoringBaseline: 145 bpm, Variability: Good {> 6 bpm), Accelerations: Reactive, and Decelerations: Absent Uterine activityNone Dilation: 1 Exam by:: Dr. Caron Presume   Prenatal labs: ABO, Rh: O/Positive/-- (11/27 0000) Antibody: Negative (11/27 0000) Rubella: Immune (11/27 0000) RPR: Non Reactive (01/22 1056)  HBsAg: Negative (11/27 0000)  HIV: Non Reactive (01/22 1056)  GBS: Positive/-- (02/28 1111)  1 hr Glucola Normal Genetic screening  LR Anatomy US normal   Prenatal Transfer Tool  Maternal Diabetes: No Genetic Screening: Normal Maternal Ultrasounds/Referrals:  Normal Fetal Ultrasounds or other Referrals:  Referred to Materal Fetal Medicine  Maternal Substance Abuse:  No Significant Maternal Medications:  None Significant Maternal Lab Results:  Group B Strep positive Number of Prenatal Visits:greater than 3 verified prenatal visits Other Comments:  None  No results found for this or any previous visit (from the past 24 hour(s)).  Patient Active Problem List   Diagnosis Date Noted   Chronic hypertension affecting pregnancy 11/28/2022   GBS (group B Streptococcus carrier), +RV culture, currently pregnant 11/16/2022   Obesity in pregnancy 11/05/2022   UTI in pregnancy, antepartum, third trimester 10/26/2022   Language barrier 10/21/2022   Unwanted fertility 10/21/2022   Encounter for supervision of other normal pregnancy, third trimester 09/16/2022   Chronic hypertension 09/16/2022   BMI 50.0-59.9, adult ([place]) 09/16/2022    Assessment/Plan:  Sheryl Carroll is a 32 y.o. [id number] at [redacted]w[redacted]d here for IOL for cHTN   #Labor:Induction started with cytotec and FB #Pain: Per patient request #FWB: Cat 1  #ID:  GBS positive, PCN #MOF: Both #MOC: BTL? #Circ:  No  Concepcion Living, MD  11/28/2022, 8:12 PM

## 2022-11-28 NOTE — H&P (Incomplete)
OBSTETRIC ADMISSION HISTORY AND PHYSICAL  Sheryl Carroll is a 32 y.o. female [phone] with IUP at [redacted]w[redacted]d by LMP presenting for IOL for cHTN . She reports +FMs, No LOF, no VB, no blurry vision, headaches or peripheral edema, and RUQ pain.  She plans on both breast and bottle feeding. She request BTL for birth control. She received her prenatal care at  [place]: By LMP --->  Estimated Date of Delivery: 12/08/22  Sono:   @[redacted]w[redacted]d , normal anatomy, posterior placenta,  @[redacted]w[redacted]d , cephalic, posterior placenta, EFW 64%, AC 92%   Prenatal History/Complications:  - cHTN well controlled on labetalol  - GBS +ve - elevated BMI  Past Medical History: Past Medical History:  Diagnosis Date   Hypertension    Medical history non-contributory     Past Surgical History: Past Surgical History:  Procedure Laterality Date   NO PAST SURGERIES      Obstetrical History: OB History  Gravida Para Term Preterm AB Living  5 3 3  0 1 3  SAB IAB Ectopic Multiple Live Births  1 0 0 0 3    # Outcome Date GA Lbr Len/2nd Weight Sex Delivery Anes PTL Lv  5 Current           4 Term 05/10/18 [redacted]w[redacted]d 10:40 / 00:04 3399 g F Vag-Spont None  LIV  3 SAB 2018 [redacted]w[redacted]d         2 Term 01/25/11    F Vag-Spont   LIV  1 Term 03/29/07    M Vag-Spont   LIV     Social History Social History   Socioeconomic History   Marital status: Single    Spouse name: Not on file   Number of children: Not on file   Years of education: Not on file   Highest education level: Not on file  Occupational History   Not on file  Tobacco Use   Smoking status: Never   Smokeless tobacco: Never  Vaping Use   Vaping Use: Never used  Substance and Sexual Activity   Alcohol use: No   Drug use: Never   Sexual activity: Yes    Birth control/protection: None    Comment: planning BTL  Other Topics Concern   Not on file  Social History Narrative   ** Merged History Encounter **       Social Determinants of Health   Financial  Resource Strain: Low Risk  (05/10/2018)   Overall Financial Resource Strain (CARDIA)    Difficulty of Paying Living Expenses: Not very hard  Food Insecurity: No Food Insecurity (09/16/2022)   Hunger Vital Sign    Worried About Running Out of Food in the Last Year: Never true    Ran Out of Food in the Last Year: Never true  Transportation Needs: No Transportation Needs (09/16/2022)   PRAPARE - Hydrologist (Medical): No    Lack of Transportation (Non-Medical): No  Physical Activity: Not on file  Stress: Not on file  Social Connections: Not on file    Family History: Family History  Problem Relation Age of Onset   Hypertension Mother    Hypertension Father    Diabetes Paternal Grandmother    Asthma Neg Hx    Cancer Neg Hx    Heart disease Neg Hx    Stroke Neg Hx     Allergies: No Known Allergies  Medications Prior to Admission  Medication Sig Dispense Refill Last Dose   aspirin EC 81  MG tablet Take 81 mg by mouth daily. Swallow whole.      labetalol (NORMODYNE) 100 MG tablet Take 50 mg by mouth daily.      Prenatal Vit-Fe Fumarate-FA (PRENATAL MULTIVITAMIN) TABS tablet Take 1 tablet by mouth daily at 12 noon.        Review of Systems   All systems reviewed and negative except as stated in HPI  Last menstrual period 03/03/2022, unknown if currently breastfeeding. General appearance: alert and cooperative Lungs: normal work of breathing Heart: regular rate and rhythm Abdomen: soft, non-tender; bowel sounds normal Presentation: {desc; fetal presentation:14558} Fetal monitoring: baseline 145, mod variability, + accel, - decel Uterine activity: rare     Prenatal labs: ABO, Rh: O/Positive/-- (11/27 0000) Antibody: Negative (11/27 0000) Rubella: Immune (11/27 0000) RPR: Non Reactive (01/22 1056)  HBsAg: Negative (11/27 0000)  HIV: Non Reactive (01/22 1056)  GBS: Positive/-- (02/28 1111)  1 hr Glucola 105 Genetic screening  low risk,  female Anatomy US norm,al  Prenatal Transfer Tool  Maternal Diabetes: No Genetic Screening: Normal Maternal Ultrasounds/Referrals: Normal Fetal Ultrasounds or other Referrals:  None Maternal Substance Abuse:  No Significant Maternal Medications:  None Significant Maternal Lab Results:  Group B Strep positive Number of Prenatal Visits:greater than 3 verified prenatal visits Other Comments:  None  No results found for this or any previous visit (from the past 24 hour(s)).  Patient Active Problem List   Diagnosis Date Noted   GBS (group B Streptococcus carrier), +RV culture, currently pregnant 11/16/2022   Obesity in pregnancy 11/05/2022   UTI in pregnancy, antepartum, third trimester 10/26/2022   Language barrier 10/21/2022   Unwanted fertility 10/21/2022   Encounter for supervision of other normal pregnancy, third trimester 09/16/2022   Chronic hypertension 09/16/2022   BMI 50.0-59.9, adult (Sheryl Carroll) 09/16/2022    Assessment/Plan:  Sheryl Carroll is a 32 y.o. [id number] at [redacted]w[redacted]d here for IOL for cHTN (well controlled)  #Labor:*** #Pain: *** #FWB: *** #ID:  *** #MOF: *** #MOC:*** #Circ:  ***  Sheryl Russian, MD  11/28/2022, 7:27 PM

## 2022-11-29 ENCOUNTER — Encounter (HOSPITAL_COMMUNITY): Payer: Self-pay | Admitting: Family Medicine

## 2022-11-29 DIAGNOSIS — O99820 Streptococcus B carrier state complicating pregnancy: Secondary | ICD-10-CM

## 2022-11-29 DIAGNOSIS — Z3A38 38 weeks gestation of pregnancy: Secondary | ICD-10-CM

## 2022-11-29 DIAGNOSIS — O1002 Pre-existing essential hypertension complicating childbirth: Secondary | ICD-10-CM

## 2022-11-29 LAB — RPR: RPR Ser Ql: NONREACTIVE

## 2022-11-29 MED ORDER — DIPHENHYDRAMINE HCL 25 MG PO CAPS: 25.0000 mg | ORAL_CAPSULE | Freq: Four times a day (QID) | ORAL | Status: DC | PRN

## 2022-11-29 MED ORDER — TETANUS-DIPHTH-ACELL PERTUSSIS 5-2.5-18.5 LF-MCG/0.5 IM SUSY: 0.5000 mL | PREFILLED_SYRINGE | Freq: Once | INTRAMUSCULAR | Status: DC

## 2022-11-29 MED ORDER — METHYLERGONOVINE MALEATE 0.2 MG/ML IJ SOLN
0.2000 mg | Freq: Once | INTRAMUSCULAR | Status: AC
  Administered 2022-11-29: 0.2 mg via INTRAMUSCULAR

## 2022-11-29 MED ORDER — BENZOCAINE-MENTHOL 20-0.5 % EX AERO: 1.0000 | INHALATION_SPRAY | CUTANEOUS | Status: DC | PRN

## 2022-11-29 MED ORDER — ONDANSETRON HCL 4 MG/2ML IJ SOLN: 4.0000 mg | INTRAMUSCULAR | Status: DC | PRN

## 2022-11-29 MED ORDER — ACETAMINOPHEN 325 MG PO TABS: 650.0000 mg | ORAL_TABLET | ORAL | Status: DC | PRN

## 2022-11-29 MED ORDER — ONDANSETRON HCL 4 MG PO TABS: 4.0000 mg | ORAL_TABLET | ORAL | Status: DC | PRN

## 2022-11-29 MED ORDER — METHYLERGONOVINE MALEATE 0.2 MG/ML IJ SOLN
INTRAMUSCULAR | Status: AC
  Filled 2022-11-29: qty 1

## 2022-11-29 MED ORDER — IBUPROFEN 600 MG PO TABS
600.0000 mg | ORAL_TABLET | Freq: Four times a day (QID) | ORAL | Status: DC
  Administered 2022-11-29 – 2022-11-30 (×5): 600 mg via ORAL
  Filled 2022-11-29 (×5): qty 1

## 2022-11-29 MED ORDER — SIMETHICONE 80 MG PO CHEW: 80.0000 mg | CHEWABLE_TABLET | ORAL | Status: DC | PRN

## 2022-11-29 MED ORDER — FENTANYL CITRATE (PF) 100 MCG/2ML IJ SOLN: 100.0000 ug | INTRAMUSCULAR | Status: DC | PRN

## 2022-11-29 MED ORDER — COCONUT OIL OIL: 1.0000 | TOPICAL_OIL | Status: DC | PRN

## 2022-11-29 MED ORDER — DIBUCAINE (PERIANAL) 1 % EX OINT: 1.0000 | TOPICAL_OINTMENT | CUTANEOUS | Status: DC | PRN

## 2022-11-29 MED ORDER — ZOLPIDEM TARTRATE 5 MG PO TABS: 5.0000 mg | ORAL_TABLET | Freq: Every evening | ORAL | Status: DC | PRN

## 2022-11-29 MED ORDER — WITCH HAZEL-GLYCERIN EX PADS: 1.0000 | MEDICATED_PAD | CUTANEOUS | Status: DC | PRN

## 2022-11-29 MED ORDER — SENNOSIDES-DOCUSATE SODIUM 8.6-50 MG PO TABS
2.0000 | ORAL_TABLET | Freq: Every day | ORAL | Status: DC
  Administered 2022-11-30: 2 via ORAL
  Filled 2022-11-29: qty 2

## 2022-11-29 MED ORDER — PRENATAL MULTIVITAMIN CH
1.0000 | ORAL_TABLET | Freq: Every day | ORAL | Status: DC
  Administered 2022-11-29 – 2022-11-30 (×2): 1 via ORAL
  Filled 2022-11-29 (×2): qty 1

## 2022-11-29 NOTE — Discharge Summary (Signed)
Postpartum Discharge Summary  Date of Service updated 11/30/22     Patient Name: Sheryl Carroll DOB: [birth year] MRN: [id number]  Date of admission: 11/28/2022 Delivery date:11/29/2022  Delivering provider: [name] A  Date of discharge: 11/30/2022  Admitting diagnosis: Chronic hypertension affecting pregnancy [O10.919] Intrauterine pregnancy: [redacted]w[redacted]d     Secondary diagnosis:  Principal Problem:   Encounter for supervision of other normal pregnancy, third trimester Active Problems:   Chronic hypertension   BMI 50.0-59.9, adult ([place])   GBS (group B Streptococcus carrier), +RV culture, currently pregnant   Chronic hypertension affecting pregnancy   SVD (spontaneous vaginal delivery)  Additional problems: None   Discharge diagnosis: Term Pregnancy Delivered                                              Post partum procedures: none   Augmentation: AROM, Cytotec, and IP Foley Complications: None  Hospital course: Induction of Labor With Vaginal Delivery   32 y.o. yo [id number] at [redacted]w[redacted]d was admitted to the hospital 11/28/2022 for induction of labor.  Indication for induction:  CHTN .  Patient had an uncomplicated labor course  Membrane Rupture Time/Date: 12:28 AM ,11/29/2022   Delivery Method:Vaginal, Spontaneous  Episiotomy: None  Lacerations:  None  Details of delivery can be found in separate delivery note.  Patient had a postpartum course uncomplicated. Patient is discharged home 11/30/22.  Newborn Data: Birth date:11/29/2022  Birth time:4:53 AM  Gender:Female  Living status:Living  Apgars:8 ,9  Weight:3650 g   Magnesium Sulfate received: No BMZ received: No Rhophylac:No MMR:No T-DaP:Given prenatally Flu: No Transfusion:No  Physical exam  Vitals:   11/29/22 1211 11/29/22 1546 11/29/22 2010 11/30/22 0500  BP: 117/68 (!) 117/59 103/62 (!) 85/58  Pulse: 83 65 82 70  Resp: 18 18 18 16   Temp: 97.8 F (36.6 C) 98.9 F (37.2 C) 98.7 F (37.1 C) 97.7 F  (36.5 C)  TempSrc: Oral Oral Oral Oral  SpO2: 98% 99% 99% 97%  Weight:      Height:       General: alert, cooperative, and no distress Lochia: appropriate Uterine Fundus: firm Incision: Healing well with no significant drainage DVT Evaluation: No evidence of DVT seen on physical exam. Labs: Lab Results  Component Value Date   WBC 10.8 (H) 11/30/2022   HGB 11.2 (L) 11/30/2022   HCT 34.0 (L) 11/30/2022   MCV 91.2 11/30/2022   PLT 205 11/30/2022      Latest Ref Rng & Units 08/16/2010   12:11 PM  CMP  Glucose 70 - 99 mg/dL 86   BUN 6 - 23 mg/dL 6   Creatinine 0.4 - 1.2 mg/dL 0.49   Sodium 135 - 145 mEq/L 136   Potassium 3.5 - 5.1 mEq/L 4.1   Chloride 96 - 112 mEq/L 106   CO2 19 - 32 mEq/L 23   Calcium 8.4 - 10.5 mg/dL 9.6   Total Protein 6.0 - 8.3 g/dL 6.3   Total Bilirubin 0.3 - 1.2 mg/dL 0.6   Alkaline Phos 39 - 117 U/L 62   AST 0 - 37 U/L 18   ALT 0 - 35 U/L 15    Edinburgh Score:    11/30/2022    6:16 AM  Edinburgh Postnatal Depression Scale Screening Tool  I have been able to laugh and see the funny side of things. 0  I have looked forward with enjoyment to things. 0  I have blamed myself unnecessarily when things went wrong. 0  I have been anxious or worried for no good reason. 0  I have felt scared or panicky for no good reason. 0  Things have been getting on top of me. 0  I have been so unhappy that I have had difficulty sleeping. 0  I have felt sad or miserable. 0  I have been so unhappy that I have been crying. 1  The thought of harming myself has occurred to me. 0  Edinburgh Postnatal Depression Scale Total 1     After visit meds:  Allergies as of 11/30/2022   No Known Allergies      Medication List     STOP taking these medications    labetalol 100 MG tablet Commonly known as: NORMODYNE       TAKE these medications    aspirin EC 81 MG tablet Take 81 mg by mouth daily. Swallow whole.   ferrous sulfate 325 (65 FE) MG EC tablet Take 1  tablet (325 mg total) by mouth every other day.   ibuprofen 600 MG tablet Commonly known as: ADVIL Take 1 tablet (600 mg total) by mouth every 6 (six) hours.   prenatal multivitamin Tabs tablet Take 1 tablet by mouth daily at 12 noon.         Discharge home in stable condition Infant Feeding:  breast and formula Infant Disposition:home with mother Discharge instruction: per After Visit Summary and Postpartum booklet. Activity: Advance as tolerated. Pelvic rest for 6 weeks.  Diet: routine diet Future Appointments:No future appointments. Follow up Visit:  [place] [facility] [place] [facility] up.   Specialty: Obstetrics and Gynecology Why: In 1 week for blood pressure check and in 4-6 weeks for postpartum visit Contact information: [place] [phone]                Message sent to [facility] on 11/29/22:  Please schedule this patient for a In person postpartum visit in 6 weeks with the following provider: Any provider. Additional Postpartum F/U:BP check 1 week  High risk pregnancy complicated by:  [facility] Delivery mode:  Vaginal, Spontaneous  Anticipated Birth Control:  Outpatient LARC   11/30/2022 [name], CNM

## 2022-11-29 NOTE — Progress Notes (Signed)
Sheryl Carroll is a 32 y.o. [id number] at [redacted]w[redacted]d admitted for IOL for [facility].  Subjective:   Objective: BP 123/77   Pulse 93   Temp 98.3 F (36.8 C) (Axillary)   Ht 5\' 1"  (1.549 m)   Wt 125.8 kg   LMP 03/03/2022 (Exact Date)   BMI 52.41 kg/m  No intake/output data recorded. No intake/output data recorded.  FHT:  FHR: 145 bpm, variability: moderate,  accelerations:  Present,  decelerations:  Absent UC:   regular, every 2 minutes SVE:   5/50/-2, vertex  AROM with clear fluid, pt tolerated well  Labs: Lab Results  Component Value Date   WBC 7.3 11/28/2022   HGB 13.6 11/28/2022   HCT 39.4 11/28/2022   MCV 88.5 11/28/2022   PLT 221 11/28/2022    Assessment / Plan: IOL due to [facility]  Foley balloon found in vagina on this exam, fluid removed by RN and foley removed at this time   Labor: Progressing normally. With foley balloon out, discussed AROM with patient to shorten the length of labor. Risks/benefits discussed. Pt elects for AROM during this exam. Preeclampsia:  labs stable Fetal Wellbeing:  Category I Pain Control:  Labor support without medications I/D:   GBS positive on PCN Anticipated MOD:  NSVD  [name], CNM 11/29/2022, 12:34 AM

## 2022-11-29 NOTE — Lactation Note (Signed)
This note was copied from a baby's chart. Lactation Consultation Note  Patient Name: Sheryl Carroll [id number] Date: 11/29/2022 Age:32 hours   Infant is at 37 hours old.  The birth parent did not want to be seen by lactation in L&D.  LC spoke with the RN Anda Kraft and she stated that the birth parent said that she does not want to be seen by lactation.  The birth parent will call for lactation assistance if needed.    Maternal Data    Feeding Mother's Current Feeding Choice: Breast Milk and Formula  LATCH Score Latch: Grasps breast easily, tongue down, lips flanged, rhythmical sucking.  Audible Swallowing: A few with stimulation  Type of Nipple: Everted at rest and after stimulation  Comfort (Breast/Nipple): Soft / non-tender  Hold (Positioning): No assistance needed to correctly position infant at breast.  LATCH Score: 9   Lactation Tools Discussed/Used    Interventions    Discharge    Consult Status Consult Status: Complete (mother declined follow up)    [place] 11/29/2022, 9:00 AM

## 2022-11-30 LAB — CBC
HCT: 34 % — ABNORMAL LOW (ref 36.0–46.0)
Hemoglobin: 11.2 g/dL — ABNORMAL LOW (ref 12.0–15.0)
MCH: 30 pg (ref 26.0–34.0)
MCHC: 32.9 g/dL (ref 30.0–36.0)
MCV: 91.2 fL (ref 80.0–100.0)
Platelets: 205 10*3/uL (ref 150–400)
RBC: 3.73 MIL/uL — ABNORMAL LOW (ref 3.87–5.11)
RDW: 13.2 % (ref 11.5–15.5)
WBC: 10.8 10*3/uL — ABNORMAL HIGH (ref 4.0–10.5)
nRBC: 0 % (ref 0.0–0.2)

## 2022-11-30 MED ORDER — FERROUS SULFATE 325 (65 FE) MG PO TBEC: 325.0000 mg | DELAYED_RELEASE_TABLET | ORAL | 2 refills | Status: DC

## 2022-11-30 MED ORDER — IBUPROFEN 600 MG PO TABS: 600.0000 mg | ORAL_TABLET | Freq: Four times a day (QID) | ORAL | 0 refills | Status: AC

## 2022-12-09 ENCOUNTER — Ambulatory Visit: Payer: BC Managed Care – PPO

## 2022-12-11 ENCOUNTER — Telehealth (HOSPITAL_COMMUNITY): Payer: Self-pay | Admitting: *Deleted

## 2022-12-11 NOTE — Telephone Encounter (Signed)
Hospital Discharge Follow-Up Call:  Spoke to patient with help of interpreter "Sharyn Lull [phone]".  Patient reports that she is well and has no concerns about her healing process.  EPDS today was 0 and she endorses this accurately reflects that she is doing well emotionally.  Patient says that baby is well and she has no concerns about baby's health.  She reports that baby sleeps in a crib.  Reviewed ABCs of Safe Sleep.

## 2023-01-12 ENCOUNTER — Ambulatory Visit: Payer: BC Managed Care – PPO | Admitting: Obstetrics and Gynecology
